# Patient Record
Sex: Female | Born: 1975 | Race: Black or African American | Hispanic: No | Marital: Married | Smoking: Never smoker
Health system: Southern US, Community
[De-identification: ages and names within clinical notes are randomized; demographics above are authoritative.]

## PROBLEM LIST (undated history)

## (undated) DIAGNOSIS — Z8719 Personal history of other diseases of the digestive system: Secondary | ICD-10-CM

## (undated) DIAGNOSIS — K8020 Calculus of gallbladder without cholecystitis without obstruction: Secondary | ICD-10-CM

## (undated) DIAGNOSIS — K219 Gastro-esophageal reflux disease without esophagitis: Secondary | ICD-10-CM

## (undated) HISTORY — PX: FOOT SURGERY: SHX648

## (undated) HISTORY — PX: TONSILLECTOMY: SUR1361

## (undated) HISTORY — PX: ESOPHAGOGASTRODUODENOSCOPY: SHX1529

## (undated) HISTORY — PX: PLACEMENT OF BREAST IMPLANTS: SHX6334

---

## 2006-08-31 ENCOUNTER — Ambulatory Visit (HOSPITAL_COMMUNITY): Admission: RE | Admit: 2006-08-31 | Discharge: 2006-08-31 | Payer: Self-pay | Admitting: Obstetrics and Gynecology

## 2016-12-25 ENCOUNTER — Ambulatory Visit (HOSPITAL_BASED_OUTPATIENT_CLINIC_OR_DEPARTMENT_OTHER)
Admit: 2016-12-25 | Discharge: 2016-12-25 | Disposition: A | Discharge status: Home / Self Care | Payer: 59 | Attending: Emergency Medicine | Admitting: Emergency Medicine

## 2016-12-25 ENCOUNTER — Other Ambulatory Visit: Payer: Self-pay

## 2016-12-25 ENCOUNTER — Emergency Department (HOSPITAL_BASED_OUTPATIENT_CLINIC_OR_DEPARTMENT_OTHER)
Admission: EM | Admit: 2016-12-25 | Discharge: 2016-12-25 | Disposition: A | Discharge status: Home / Self Care | Payer: 59 | Attending: Emergency Medicine | Admitting: Emergency Medicine

## 2016-12-25 ENCOUNTER — Encounter (HOSPITAL_BASED_OUTPATIENT_CLINIC_OR_DEPARTMENT_OTHER): Payer: Self-pay

## 2016-12-25 ENCOUNTER — Emergency Department (HOSPITAL_BASED_OUTPATIENT_CLINIC_OR_DEPARTMENT_OTHER): Payer: 59

## 2016-12-25 DIAGNOSIS — K8020 Calculus of gallbladder without cholecystitis without obstruction: Secondary | ICD-10-CM | POA: Diagnosis not present

## 2016-12-25 DIAGNOSIS — R112 Nausea with vomiting, unspecified: Secondary | ICD-10-CM | POA: Diagnosis not present

## 2016-12-25 DIAGNOSIS — R1013 Epigastric pain: Secondary | ICD-10-CM | POA: Insufficient documentation

## 2016-12-25 LAB — COMPREHENSIVE METABOLIC PANEL
ALK PHOS: 45 U/L (ref 38–126)
ALT: 11 U/L — AB (ref 14–54)
AST: 19 U/L (ref 15–41)
Albumin: 4 g/dL (ref 3.5–5.0)
Anion gap: 6 (ref 5–15)
BUN: 13 mg/dL (ref 6–20)
CALCIUM: 9 mg/dL (ref 8.9–10.3)
CHLORIDE: 107 mmol/L (ref 101–111)
CO2: 24 mmol/L (ref 22–32)
CREATININE: 0.74 mg/dL (ref 0.44–1.00)
Glucose, Bld: 100 mg/dL — ABNORMAL HIGH (ref 65–99)
Potassium: 3.8 mmol/L (ref 3.5–5.1)
Sodium: 137 mmol/L (ref 135–145)
Total Bilirubin: 0.5 mg/dL (ref 0.3–1.2)
Total Protein: 7.2 g/dL (ref 6.5–8.1)

## 2016-12-25 LAB — CBC WITH DIFFERENTIAL/PLATELET
BASOS ABS: 0 10*3/uL (ref 0.0–0.1)
Basophils Relative: 0 %
Eosinophils Absolute: 0.1 10*3/uL (ref 0.0–0.7)
Eosinophils Relative: 1 %
HEMATOCRIT: 38.2 % (ref 36.0–46.0)
HEMOGLOBIN: 12.7 g/dL (ref 12.0–15.0)
LYMPHS ABS: 1.7 10*3/uL (ref 0.7–4.0)
LYMPHS PCT: 23 %
MCH: 31.9 pg (ref 26.0–34.0)
MCHC: 33.2 g/dL (ref 30.0–36.0)
MCV: 96 fL (ref 78.0–100.0)
Monocytes Absolute: 0.5 10*3/uL (ref 0.1–1.0)
Monocytes Relative: 7 %
NEUTROS ABS: 4.9 10*3/uL (ref 1.7–7.7)
NEUTROS PCT: 69 %
PLATELETS: 245 10*3/uL (ref 150–400)
RBC: 3.98 MIL/uL (ref 3.87–5.11)
RDW: 11.4 % — ABNORMAL LOW (ref 11.5–15.5)
WBC: 7.2 10*3/uL (ref 4.0–10.5)

## 2016-12-25 LAB — PREGNANCY, URINE: Preg Test, Ur: NEGATIVE

## 2016-12-25 LAB — TROPONIN I

## 2016-12-25 LAB — LIPASE, BLOOD: Lipase: 27 U/L (ref 11–51)

## 2016-12-25 MED ORDER — TRAMADOL HCL 50 MG PO TABS: 50.0000 mg | ORAL_TABLET | Freq: Four times a day (QID) | ORAL | 0 refills | Status: DC | PRN

## 2016-12-25 MED ORDER — GI COCKTAIL ~~LOC~~
30.0000 mL | Freq: Once | ORAL | Status: AC
  Administered 2016-12-25: 30 mL via ORAL
  Filled 2016-12-25: qty 30

## 2016-12-25 MED ORDER — ONDANSETRON HCL 4 MG/2ML IJ SOLN
4.0000 mg | Freq: Once | INTRAMUSCULAR | Status: AC
  Administered 2016-12-25: 4 mg via INTRAVENOUS
  Filled 2016-12-25: qty 2

## 2016-12-25 MED ORDER — DICYCLOMINE HCL 10 MG/ML IM SOLN
20.0000 mg | Freq: Once | INTRAMUSCULAR | Status: AC
  Administered 2016-12-25: 20 mg via INTRAMUSCULAR
  Filled 2016-12-25: qty 2

## 2016-12-25 MED ORDER — OMEPRAZOLE 20 MG PO CPDR: 20.0000 mg | DELAYED_RELEASE_CAPSULE | Freq: Every day | ORAL | 0 refills | Status: DC

## 2016-12-25 MED ORDER — KETOROLAC TROMETHAMINE 30 MG/ML IJ SOLN
15.0000 mg | Freq: Once | INTRAMUSCULAR | Status: AC
  Administered 2016-12-25: 15 mg via INTRAVENOUS
  Filled 2016-12-25: qty 1

## 2016-12-25 NOTE — ED Notes (Signed)
ED Provider at bedside. 

## 2016-12-25 NOTE — ED Provider Notes (Signed)
[facility] EMERGENCY DEPARTMENT Provider Note   CSN: [id number] Arrival date & time: 12/25/16  0251     History   Chief Complaint Chief Complaint  Patient presents with  . Abdominal Pain    HPI Teresa Flores is a 41 y.o. female.  The history is provided by the patient.  Abdominal Pain   This is a new problem. The current episode started 3 to 5 hours ago. The problem occurs constantly. The problem has not changed since onset.The pain is associated with an unknown (ate shrimp and drank wine prior to going to bed) factor. The pain is located in the epigastric region. The quality of the pain is burning. The pain is at a severity of 10/10. The pain is severe. Associated symptoms include nausea and vomiting. Pertinent negatives include anorexia, fever, belching, diarrhea, hematochezia, constipation, dysuria, frequency and hematuria. Nothing aggravates the symptoms. Nothing relieves the symptoms. Past workup does not include GI consult. Her past medical history does not include PUD, GERD or ulcerative colitis.    History reviewed. No pertinent past medical history.  There are no active problems to display for this patient.   Past Surgical History:  Procedure Laterality Date  . TONSILLECTOMY      OB History    No data available       Home Medications    Prior to Admission medications   Not on File    Family History No family history on file.  Social History Social History   Tobacco Use  . Smoking status: Never Smoker  . Smokeless tobacco: Never Used  Substance Use Topics  . Alcohol use: No    Frequency: Never  . Drug use: No     Allergies   Augmentin [amoxicillin-pot clavulanate]   Review of Systems Review of Systems  Constitutional: Negative for diaphoresis and fever.  Respiratory: Negative for chest tightness and shortness of breath.   Cardiovascular: Negative for chest pain, palpitations and leg swelling.  Gastrointestinal: Positive for  abdominal pain, nausea and vomiting. Negative for anorexia, blood in stool, constipation, diarrhea and hematochezia.  Genitourinary: Negative for dysuria, frequency and hematuria.  All other systems reviewed and are negative.    Physical Exam Updated Vital Signs BP 119/67 (BP Location: Left Arm)   Pulse 65   Temp 97.9 F (36.6 C) (Oral)   Resp (!) 22   Ht 5\' 4"  (1.626 m)   Wt 79.4 kg (175 lb)   LMP 12/05/2016   SpO2 100%   BMI 30.04 kg/m   Physical Exam  Constitutional: She is oriented to person, place, and time. She appears well-developed and well-nourished. No distress.  HENT:  Head: Normocephalic and atraumatic.  Mouth/Throat: Oropharynx is clear and moist. No oropharyngeal exudate.  Eyes: Conjunctivae are normal. Pupils are equal, round, and reactive to light.  Neck: Normal range of motion. Neck supple.  Cardiovascular: Normal rate, regular rhythm, normal heart sounds and intact distal pulses.  Pulmonary/Chest: Effort normal and breath sounds normal. No stridor. She has no wheezes.  Abdominal: Soft. She exhibits no mass. Bowel sounds are increased. There is no hepatosplenomegaly. There is no tenderness. There is no rigidity, no rebound, no guarding, no tenderness at McBurney's point and negative Murphy's sign.  Musculoskeletal: Normal range of motion.  Neurological: She is alert and oriented to person, place, and time. She displays normal reflexes.  Skin: Skin is warm and dry. Capillary refill takes less than 2 seconds.  Psychiatric: She has a normal mood and affect.  Nursing note and vitals reviewed.    ED Treatments / Results  Labs (all labs ordered are listed, but only abnormal results are displayed)  Results for orders placed or performed during the hospital encounter of 12/25/16  CBC with Differential/Platelet  Result Value Ref Range   WBC 7.2 4.0 - 10.5 K/uL   RBC 3.98 3.87 - 5.11 MIL/uL   Hemoglobin 12.7 12.0 - 15.0 g/dL   HCT 16.1 09.6 - 04.5 %   MCV 96.0  78.0 - 100.0 fL   MCH 31.9 26.0 - 34.0 pg   MCHC 33.2 30.0 - 36.0 g/dL   RDW 40.9 (L) 81.1 - 91.4 %   Platelets 245 150 - 400 K/uL   Neutrophils Relative % 69 %   Neutro Abs 4.9 1.7 - 7.7 K/uL   Lymphocytes Relative 23 %   Lymphs Abs 1.7 0.7 - 4.0 K/uL   Monocytes Relative 7 %   Monocytes Absolute 0.5 0.1 - 1.0 K/uL   Eosinophils Relative 1 %   Eosinophils Absolute 0.1 0.0 - 0.7 K/uL   Basophils Relative 0 %   Basophils Absolute 0.0 0.0 - 0.1 K/uL  Comprehensive metabolic panel  Result Value Ref Range   Sodium 137 135 - 145 mmol/L   Potassium 3.8 3.5 - 5.1 mmol/L   Chloride 107 101 - 111 mmol/L   CO2 24 22 - 32 mmol/L   Glucose, Bld 100 (H) 65 - 99 mg/dL   BUN 13 6 - 20 mg/dL   Creatinine, Ser 7.82 0.44 - 1.00 mg/dL   Calcium 9.0 8.9 - 95.6 mg/dL   Total Protein 7.2 6.5 - 8.1 g/dL   Albumin 4.0 3.5 - 5.0 g/dL   AST 19 15 - 41 U/L   ALT 11 (L) 14 - 54 U/L   Alkaline Phosphatase 45 38 - 126 U/L   Total Bilirubin 0.5 0.3 - 1.2 mg/dL   GFR calc non Af Amer >60 >60 mL/min   GFR calc Af Amer >60 >60 mL/min   Anion gap 6 5 - 15  Troponin I  Result Value Ref Range   Troponin I <0.03 <0.03 ng/mL  Pregnancy, urine  Result Value Ref Range   Preg Test, Ur NEGATIVE NEGATIVE   No results found.  EKG  EKG Interpretation  Date/Time:  Saturday December 25 2016 03:11:12 EST Ventricular Rate:  47 PR Interval:    QRS Duration: 117 QT Interval:  431 QTC Calculation: 381 R Axis:   54 Text Interpretation:  Sinus bradycardia Confirmed by Kimba Lottes ([id number]) on 12/25/2016 3:25:57 AM       Radiology No results found.  Procedures Procedures (including critical care time)  Medications Ordered in ED Medications  gi cocktail (Maalox,Lidocaine,Donnatal) (30 mLs Oral Given 12/25/16 0316)  dicyclomine (BENTYL) injection 20 mg (20 mg Intramuscular Given 12/25/16 0325)  ondansetron (ZOFRAN) injection 4 mg (4 mg Intravenous Given 12/25/16 0324)  ketorolac (TORADOL) 30 MG/ML injection  15 mg (15 mg Intravenous Given 12/25/16 0350)       Final Clinical Impressions(s) / ED Diagnoses   Patient denies RUQ pain and has no tenderness on exam and states it is non radiating and in below the xiphoid. There are gallstones seen on CT scan.  Patient was offered transfer to [facility] for US of the gall bladder.  Patient declines and would rather follow up for outpatient [place] at [facility]. She is advised no eating prior to the [place].  No greasy or spicy foods.  Will prescribe ultram for severe  pain and prilosec.  I suspect H Pylori and have advised gerd friendly diet and close follow up with GI and [facility] for elective removal of the gall bladder.  HEART score is zero and the patient is a very low risk for mace.  Patient felt improved post GI cocktail and I believe the symptoms are GI in nature.  Patient verbalizes understanding of all instructions and agrees to follow up.    Strict return precautions for intractable  vomiting,  chest pain, dyspnea on exertion,shortness of breath, persistent abdominal pain, Inability to tolerate liquids or food, cough, altered mental status or any concerns. No signs of systemic illness or infection. The patient is nontoxic-appearing on exam and vital signs are within normal limits.    I have reviewed the triage vital signs and the nursing notes. Pertinent labs &imaging results that were available during my care of the patient were reviewed by me and considered in my medical decision making (see chart for details).  After history, exam, and medical workup I feel the patient has been appropriately medically screened and is safe for discharge home. Pertinent diagnoses were discussed with the patient. Patient was given return precautions   Zelma Mazariego, MD 12/25/16 [id number]

## 2016-12-25 NOTE — ED Triage Notes (Signed)
Pt presents with abdominal "burning" and vomiting, onset 11pm last night. Pt took Pepto bismol prior to arrival with no relief. Pt denies diarrhea.

## 2016-12-25 NOTE — ED Notes (Signed)
Pt returned from CT °

## 2016-12-25 NOTE — ED Notes (Addendum)
Pt transported to CT ?

## 2017-01-19 ENCOUNTER — Other Ambulatory Visit: Payer: Self-pay | Admitting: General Surgery

## 2017-02-13 NOTE — H&P (Signed)
Teresa ButtsAnn Flores [facility] Patient #: [id number] DOB: [birth year] Married / Language: English / Race: Black or African American Female         History of Present Illness The patient is a 42 year old female who presents for evaluation of gall stones. This is a very pleasant 42 year old woman, referred by Dr. Daun PeacockPalombo in the ED for evaluation of gallstones and abdominal pain. Dr. Rita OharaSamuel Kelley is her PCP. Dr. Bosie ClosSchooler is her gastroenterologist and performed  upper endoscopy on Jan. 4, 2019.Marland Kitchen. She is feeling good today.      She has had some mild episodes of nausea over the past month or so. On December 8, after eating a plate of spicy shrimp, she had a episode of severe, horrible epigastric and right upper quadrant pain associated with nausea and vomiting and burning sensation. This lasted almost 12 hours. She went to the emergency department was given a GI cocktail. All of her lab work was normal. Urine pregnancy test was normal. She got a CT scan, stone protocol which showed no kidney stones or pancreatitis but gallstones were noted. She had a follow-up ultrasound on the same day which showed multiple gallstones, no inflammatory changes, CBD 5 mm. Pain was mostly subsided after the ultrasound was done. Since that time she's had some mild nausea after meals. She is having alternating constipation and loose stools but nothing real dramatic. She is back at work       She was referred to me and to Dr. Bosie ClosSchooler. She has seen Dr. Bosie ClosSchooler and recent endoscopy shows chronic inactive gastritis, H. Pylori negative. This does not explain her symptoms..     Past history is negative for any significant medical or surgical problems. BMI 31 Family history reveals mother died of lung cancer. Father died of lung cancer. Social history reveals she is married with 2 children. Denies tobacco. Takes alcohol but only occasionally. 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      I told her that her episode of pain sounds like a bad gallbladder attack and I was suspicious that this was due to gallbladder disease and would recur. I advised low-fat diet      I've discussed the indications, details, techniques, and risks of the surgery in detail. She is aware of the risk of bleeding, infection, conversion to open laparotomy, postop bile leak, port site hernia, injury to adjacent organs with major reconstructive surgery. She understands all of these issues. All of her questions are answered. She agrees with the algorithm for care.      Past Surgical History  Breast Augmentation  Bilateral. Foot Surgery  Left. Oral Surgery  Tonsillectomy   Diagnostic Studies History  Colonoscopy  never Mammogram  1-3 years ago Pap Smear  1-5 years ago  Allergies  Augmentin *PENICILLINS*  Minocycline HCl *TETRACYCLINES*  Allergies Reconciled   Medication History Omeprazole (20MG  Tablet DR, Oral) Active. Medications Reconciled  Social History Alcohol use  Occasional alcohol use. Caffeine use  Tea. No drug use  Tobacco use  Never smoker.  Family History  Cancer  Father, Mother.  Pregnancy / Birth History  Contraceptive History  Contraceptive implant. Gravida  4 Length (months) of breastfeeding  12-24 Maternal age  40-25 Para  2 Regular periods   Other Problems  Cholelithiasis  Hypercholesterolemia     Review of Systems General Not Present- Appetite Loss,  Chills, Fatigue, Fever, Night Sweats, Weight Gain and Weight Loss. Skin Not Present- Change in Wart/Mole, Dryness, Hives, Jaundice, New Lesions, Non-Healing Wounds, Rash and Ulcer. HEENT Present- Wears glasses/contact lenses. Not Present- Earache, Hearing Loss, Hoarseness, Nose Bleed, Oral Ulcers, Ringing in the Ears, Seasonal Allergies, Sinus Pain, Sore Throat, Visual  Disturbances and Yellow Eyes. Respiratory Not Present- Bloody sputum, Chronic Cough, Difficulty Breathing, Snoring and Wheezing. Breast Not Present- Breast Mass, Breast Pain, Nipple Discharge and Skin Changes. Cardiovascular Not Present- Chest Pain, Difficulty Breathing Lying Down, Leg Cramps, Palpitations, Rapid Heart Rate, Shortness of Breath and Swelling of Extremities. Gastrointestinal Not Present- Abdominal Pain, Bloating, Bloody Stool, Change in Bowel Habits, Chronic diarrhea, Constipation, Difficulty Swallowing, Excessive gas, Gets full quickly at meals, Hemorrhoids, Indigestion, Nausea, Rectal Pain and Vomiting. Neurological Not Present- Decreased Memory, Fainting, Headaches, Numbness, Seizures, Tingling, Tremor, Trouble walking and Weakness. Psychiatric Not Present- Anxiety, Bipolar, Change in Sleep Pattern, Depression, Fearful and Frequent crying. Endocrine Not Present- Cold Intolerance, Excessive Hunger, Hair Changes, Heat Intolerance, Hot flashes and New Diabetes. Hematology Not Present- Blood Thinners, Easy Bruising, Excessive bleeding, Gland problems, HIV and Persistent Infections.  Vitals  Weight: 184.5 lb Height: 64in Body Surface Area: 1.89 m Body Mass Index: 31.67 kg/m  Pulse: 97 (Regular)  BP: 125/72 (Sitting, Left Arm, Standard)       Physical Exam  General Mental Status-Alert. General Appearance-Consistent with stated age. Hydration-Well hydrated. Voice-Normal. Note: BMI 31.6   Head and Neck Head-normocephalic, atraumatic with no lesions or palpable masses. Trachea-midline. Thyroid Gland Characteristics - normal size and consistency.  Eye Eyeball - Bilateral-Extraocular movements intact. Sclera/Conjunctiva - Bilateral-No scleral icterus.  Chest and Lung Exam Chest and lung exam reveals -quiet, even and easy respiratory effort with no use of accessory muscles and on auscultation, normal breath sounds, no adventitious sounds  and normal vocal resonance. Inspection Chest Wall - Normal. Back - normal.  Cardiovascular Cardiovascular examination reveals -normal heart sounds, regular rate and rhythm with no murmurs and normal pedal pulses bilaterally.  Abdomen Inspection Inspection of the abdomen reveals - No Hernias. Skin - Scar - no surgical scars. Palpation/Percussion Palpation and Percussion of the abdomen reveal - Soft, Non Tender, No Rebound tenderness, No Rigidity (guarding) and No hepatosplenomegaly. Auscultation Auscultation of the abdomen reveals - Bowel sounds normal. Note: Abdomen soft. Nondistended. Nontender. No mass. No percussion tenderness subcostal margin.   Neurologic Neurologic evaluation reveals -alert and oriented x 3 with no impairment of recent or remote memory. Mental Status-Normal.  Musculoskeletal Normal Exam - Left-Upper Extremity Strength Normal and Lower Extremity Strength Normal. Normal Exam - Right-Upper Extremity Strength Normal and Lower Extremity Strength Normal.  Lymphatic Head & Neck  General Head & Neck Lymphatics: Bilateral - Description - Normal. Axillary  General Axillary Region: Bilateral - Description - Normal. Tenderness - Non Tender. Femoral & Inguinal  Generalized Femoral & Inguinal Lymphatics: Bilateral - Description - Normal. Tenderness - Non Tender.    Assessment & Plan  GALLSTONES (K80.20)    Your recent episode of upper abdominal and right upper quadrant pain and nausea and vomiting sounds like a gallbladder attack You have had some mild episodes of nausea both before and after that severe attack your CT scan confirms the presence of multiple gallstones but no other significant problems and certainly no other source of pain was found on these imaging studies. Your lab work is normal so I do not suspect that you have suffered a complication of gallstone disease.  It is possible that there could  be a problem with your esophagus or  stomach, and you are scheduled to have an upper endoscopy by Dr. Bosie Clos this coming Friday  If Dr. Bosie Clos does not find anything, then clearly your episode of pain was due to your gallbladder, and is likely to recur  For now, I recommend a low-fat diet  Since Dr. Bosie Clos did not find anything diagnostic, then elective cholecystectomy is advised as you are likely to have further attacks in the future.  We have discussed the indications, details, techniques, and risks of this surgery in detail Please review the patient information booklet that I reviewed with you Let me know when you are ready to schedule gallbladder surgery  BMI 31.0-31.9,ADULT (Z68.31)    Angelia Mould. Derrell Lolling, M.D., [facility] General and Minimally invasive Surgery Breast and Colorectal Surgery Office:   [phone] Pager:   [phone]

## 2017-02-15 NOTE — Pre-Procedure Instructions (Signed)
Teresa Flores  02/15/2017      [id number] [place],  - [address] [place] [id number] Phone: [phone] Fax: [phone]    Your procedure is scheduled on February 1  [facility]30 [facility].M.  Call this number if you have problems the morning of surgery:  [phone]   Remember:  Do not eat food or drink liquids after midnight.  Continue all medications as directed by your physician except follow these medication instructions before surgery below  Please complete your PRE-SURGERY ENSURE that was given to before you leave your house the morning of surgery.  Please, if able, drink it in one setting. DO NOT SIP.   Take these medicines the morning of surgery with A SIP OF WATER  omeprazole (PRILOSEC)  7 days prior to surgery STOP taking any Aspirin(unless otherwise instructed by your surgeon), Aleve, Naproxen, Ibuprofen, Motrin, Advil, Goody's, BC's, all herbal medications, fish oil, and all vitamins    Do not wear jewelry, make-up or nail polish.  Do not wear lotions, powders, or perfumes, or deodorant.  Do not shave 48 hours prior to surgery.   Do not bring valuables to the hospital.  [facility] is not responsible for any belongings or valuables.  Contacts, dentures or bridgework may not be worn into surgery.  Leave your suitcase in the car.  After surgery it may be brought to your room.  For patients admitted to the hospital, discharge time will be determined by your treatment team.  Patients discharged the day of surgery will not be allowed to drive home.    Special instructions:   [place]- Preparing For Surgery  Before surgery, you can play an important role. Because skin is not sterile, your skin needs to be as free of germs as possible. You can reduce the number of germs on your skin by washing with CHG (chlorahexidine gluconate) Soap before surgery.  CHG is an antiseptic  cleaner which kills germs and bonds with the skin to continue killing germs even after washing.  Please do not use if you have an allergy to CHG or antibacterial soaps. If your skin becomes reddened/irritated stop using the CHG.  Do not shave (including legs and underarms) for at least 48 hours prior to first CHG shower. It is OK to shave your face.  Please follow these instructions carefully.   1. Shower the NIGHT BEFORE SURGERY and the MORNING OF SURGERY with CHG.   2. If you chose to wash your hair, wash your hair first as usual with your normal shampoo.  3. After you shampoo, rinse your hair and body thoroughly to remove the shampoo.  4. Use CHG as you would any other liquid soap. You can apply CHG directly to the skin and wash gently with a scrungie or a clean washcloth.   5. Apply the CHG Soap to your body ONLY FROM THE NECK DOWN.  Do not use on open wounds or open sores. Avoid contact with your eyes, ears, mouth and genitals (private parts). Wash Face and genitals (private parts)  with your normal soap.  6. Wash thoroughly, paying special attention to the area where your surgery will be performed.  7. Thoroughly rinse your body with warm water from the neck down.  8. DO NOT shower/wash with your normal soap after using and rinsing off the CHG Soap.  9. Pat yourself dry with a CLEAN TOWEL.  10. Wear CLEAN  PAJAMAS to bed the night before surgery, wear comfortable clothes the morning of surgery  11. Place CLEAN SHEETS on your bed the night of your first shower and DO NOT SLEEP WITH PETS.    Day of Surgery: Do not apply any deodorants/lotions. Please wear clean clothes to the hospital/surgery center.      Please read over the following fact sheets that you were given.

## 2017-02-16 ENCOUNTER — Encounter (HOSPITAL_COMMUNITY)
Admission: RE | Admit: 2017-02-16 | Discharge: 2017-02-16 | Disposition: A | Discharge status: Home / Self Care | Payer: 59 | Source: Ambulatory Visit | Attending: General Surgery | Admitting: General Surgery

## 2017-02-16 ENCOUNTER — Encounter (HOSPITAL_COMMUNITY): Payer: Self-pay

## 2017-02-16 ENCOUNTER — Other Ambulatory Visit: Payer: Self-pay

## 2017-02-16 DIAGNOSIS — K2950 Unspecified chronic gastritis without bleeding: Secondary | ICD-10-CM | POA: Diagnosis not present

## 2017-02-16 DIAGNOSIS — Z880 Allergy status to penicillin: Secondary | ICD-10-CM | POA: Diagnosis not present

## 2017-02-16 DIAGNOSIS — K219 Gastro-esophageal reflux disease without esophagitis: Secondary | ICD-10-CM | POA: Diagnosis not present

## 2017-02-16 DIAGNOSIS — K5900 Constipation, unspecified: Secondary | ICD-10-CM | POA: Diagnosis not present

## 2017-02-16 DIAGNOSIS — E7800 Pure hypercholesterolemia, unspecified: Secondary | ICD-10-CM | POA: Diagnosis not present

## 2017-02-16 DIAGNOSIS — Z79899 Other long term (current) drug therapy: Secondary | ICD-10-CM | POA: Diagnosis not present

## 2017-02-16 DIAGNOSIS — K8010 Calculus of gallbladder with chronic cholecystitis without obstruction: Secondary | ICD-10-CM | POA: Diagnosis present

## 2017-02-16 HISTORY — DX: Gastro-esophageal reflux disease without esophagitis: K21.9

## 2017-02-16 HISTORY — DX: Personal history of other diseases of the digestive system: Z87.19

## 2017-02-16 LAB — COMPREHENSIVE METABOLIC PANEL
ALK PHOS: 46 U/L (ref 38–126)
ALT: 15 U/L (ref 14–54)
AST: 19 U/L (ref 15–41)
Albumin: 3.7 g/dL (ref 3.5–5.0)
Anion gap: 9 (ref 5–15)
BUN: 9 mg/dL (ref 6–20)
CALCIUM: 9.7 mg/dL (ref 8.9–10.3)
CO2: 25 mmol/L (ref 22–32)
Chloride: 103 mmol/L (ref 101–111)
Creatinine, Ser: 0.81 mg/dL (ref 0.44–1.00)
Glucose, Bld: 89 mg/dL (ref 65–99)
Potassium: 4.2 mmol/L (ref 3.5–5.1)
Sodium: 137 mmol/L (ref 135–145)
Total Bilirubin: 0.6 mg/dL (ref 0.3–1.2)
Total Protein: 6.9 g/dL (ref 6.5–8.1)

## 2017-02-16 LAB — CBC WITH DIFFERENTIAL/PLATELET
Basophils Absolute: 0 10*3/uL (ref 0.0–0.1)
Basophils Relative: 0 %
Eosinophils Absolute: 0.1 10*3/uL (ref 0.0–0.7)
Eosinophils Relative: 2 %
HCT: 37.9 % (ref 36.0–46.0)
HEMOGLOBIN: 12.6 g/dL (ref 12.0–15.0)
LYMPHS ABS: 1.6 10*3/uL (ref 0.7–4.0)
LYMPHS PCT: 21 %
MCH: 32.2 pg (ref 26.0–34.0)
MCHC: 33.2 g/dL (ref 30.0–36.0)
MCV: 96.9 fL (ref 78.0–100.0)
Monocytes Absolute: 0.5 10*3/uL (ref 0.1–1.0)
Monocytes Relative: 7 %
NEUTROS ABS: 5.2 10*3/uL (ref 1.7–7.7)
NEUTROS PCT: 70 %
Platelets: 295 10*3/uL (ref 150–400)
RBC: 3.91 MIL/uL (ref 3.87–5.11)
RDW: 12 % (ref 11.5–15.5)
WBC: 7.5 10*3/uL (ref 4.0–10.5)

## 2017-02-16 NOTE — Progress Notes (Addendum)
PCP is Dr. Jenita SeashoreSamuel Flores Denies ever seeing a cardiologist.  Denies ever having a stress test, echo, or card cath. Denies any chest pain or fever. Reports a non productive cough.  Ensure pre-surgery drink given with instructions to drink right before leaving her home on the day of surgery.- voices understanding.

## 2017-02-17 NOTE — Anesthesia Preprocedure Evaluation (Addendum)
Anesthesia Evaluation  Patient identified by MRN, date of birth, ID band Patient awake    Reviewed: Allergy & Precautions, NPO status , Patient's Chart, lab work & pertinent test results  Airway Mallampati: I       Dental no notable dental hx. (+) Teeth Intact   Pulmonary neg pulmonary ROS,    Pulmonary exam normal breath sounds clear to auscultation       Cardiovascular Normal cardiovascular exam Rhythm:Regular Rate:Normal     Neuro/Psych negative neurological ROS  negative psych ROS   GI/Hepatic Neg liver ROS, hiatal hernia,   Endo/Other  negative endocrine ROS  Renal/GU negative Renal ROS  negative genitourinary   Musculoskeletal negative musculoskeletal ROS (+)   Abdominal Normal abdominal exam  (+)   Peds  Hematology negative hematology ROS (+)   Anesthesia Other Findings   Reproductive/Obstetrics negative OB ROS                            Anesthesia Physical Anesthesia Plan  ASA: II  Anesthesia Plan: General   Post-op Pain Management:    Induction: Intravenous  PONV Risk Score and Plan: 4 or greater and Ondansetron, Dexamethasone, Midazolam and Scopolamine patch - Pre-op  Airway Management Planned: Oral ETT  Additional Equipment:   Intra-op Plan:   Post-operative Plan: Extubation in OR  Informed Consent: I have reviewed the patients History and Physical, chart, labs and discussed the procedure including the risks, benefits and alternatives for the proposed anesthesia with the patient or authorized representative who has indicated his/her understanding and acceptance.   Dental advisory given  Plan Discussed with: CRNA and Surgeon  Anesthesia Plan Comments:        Anesthesia Quick Evaluation

## 2017-02-18 ENCOUNTER — Ambulatory Visit (HOSPITAL_COMMUNITY)
Admission: RE | Admit: 2017-02-18 | Discharge: 2017-02-18 | Disposition: A | Discharge status: Home / Self Care | Payer: 59 | Source: Ambulatory Visit | Attending: General Surgery | Admitting: General Surgery

## 2017-02-18 ENCOUNTER — Ambulatory Visit (HOSPITAL_COMMUNITY): Payer: 59 | Admitting: Emergency Medicine

## 2017-02-18 ENCOUNTER — Ambulatory Visit (HOSPITAL_COMMUNITY): Payer: 59 | Admitting: Anesthesiology

## 2017-02-18 ENCOUNTER — Ambulatory Visit (HOSPITAL_COMMUNITY): Payer: 59

## 2017-02-18 ENCOUNTER — Encounter (HOSPITAL_COMMUNITY)
Admission: RE | Disposition: A | Discharge status: Home / Self Care | Payer: Self-pay | Source: Ambulatory Visit | Attending: General Surgery

## 2017-02-18 ENCOUNTER — Encounter (HOSPITAL_COMMUNITY): Payer: Self-pay

## 2017-02-18 DIAGNOSIS — K8010 Calculus of gallbladder with chronic cholecystitis without obstruction: Secondary | ICD-10-CM | POA: Insufficient documentation

## 2017-02-18 DIAGNOSIS — Z880 Allergy status to penicillin: Secondary | ICD-10-CM | POA: Insufficient documentation

## 2017-02-18 DIAGNOSIS — E7800 Pure hypercholesterolemia, unspecified: Secondary | ICD-10-CM | POA: Insufficient documentation

## 2017-02-18 DIAGNOSIS — Z419 Encounter for procedure for purposes other than remedying health state, unspecified: Secondary | ICD-10-CM

## 2017-02-18 DIAGNOSIS — K8020 Calculus of gallbladder without cholecystitis without obstruction: Secondary | ICD-10-CM | POA: Diagnosis present

## 2017-02-18 DIAGNOSIS — K219 Gastro-esophageal reflux disease without esophagitis: Secondary | ICD-10-CM | POA: Insufficient documentation

## 2017-02-18 DIAGNOSIS — Z79899 Other long term (current) drug therapy: Secondary | ICD-10-CM | POA: Insufficient documentation

## 2017-02-18 DIAGNOSIS — K2950 Unspecified chronic gastritis without bleeding: Secondary | ICD-10-CM | POA: Insufficient documentation

## 2017-02-18 DIAGNOSIS — K5900 Constipation, unspecified: Secondary | ICD-10-CM | POA: Insufficient documentation

## 2017-02-18 HISTORY — DX: Calculus of gallbladder without cholecystitis without obstruction: K80.20

## 2017-02-18 HISTORY — PX: CHOLECYSTECTOMY: SHX55

## 2017-02-18 LAB — POCT PREGNANCY, URINE: Preg Test, Ur: NEGATIVE

## 2017-02-18 SURGERY — LAPAROSCOPIC CHOLECYSTECTOMY WITH INTRAOPERATIVE CHOLANGIOGRAM
Anesthesia: General | Site: Abdomen

## 2017-02-18 MED ORDER — DEXAMETHASONE SODIUM PHOSPHATE 10 MG/ML IJ SOLN
INTRAMUSCULAR | Status: DC | PRN
  Administered 2017-02-18: 10 mg via INTRAVENOUS

## 2017-02-18 MED ORDER — CELECOXIB 200 MG PO CAPS
200.0000 mg | ORAL_CAPSULE | ORAL | Status: AC
  Administered 2017-02-18: 200 mg via ORAL
  Filled 2017-02-18: qty 1

## 2017-02-18 MED ORDER — LIDOCAINE IN D5W 4-5 MG/ML-% IV SOLN
1.0000 mg/min | INTRAVENOUS | Status: DC
  Filled 2017-02-18: qty 500

## 2017-02-18 MED ORDER — HYDROMORPHONE HCL 1 MG/ML IJ SOLN
0.2500 mg | INTRAMUSCULAR | Status: DC | PRN
  Administered 2017-02-18: 0.5 mg via INTRAVENOUS

## 2017-02-18 MED ORDER — HYDROCODONE-ACETAMINOPHEN 5-325 MG PO TABS: 1.0000 | ORAL_TABLET | Freq: Four times a day (QID) | ORAL | 0 refills | Status: DC | PRN

## 2017-02-18 MED ORDER — IOPAMIDOL (ISOVUE-300) INJECTION 61%
INTRAVENOUS | Status: AC
  Filled 2017-02-18: qty 50

## 2017-02-18 MED ORDER — SUGAMMADEX SODIUM 200 MG/2ML IV SOLN
INTRAVENOUS | Status: DC | PRN
  Administered 2017-02-18: 167.8 mg via INTRAVENOUS

## 2017-02-18 MED ORDER — BUPIVACAINE-EPINEPHRINE 0.5% -1:200000 IJ SOLN
INTRAMUSCULAR | Status: DC | PRN
  Administered 2017-02-18: 15 mL

## 2017-02-18 MED ORDER — KETOROLAC TROMETHAMINE 30 MG/ML IJ SOLN
30.0000 mg | Freq: Once | INTRAMUSCULAR | Status: DC | PRN
  Administered 2017-02-18: 30 mg via INTRAVENOUS

## 2017-02-18 MED ORDER — ONDANSETRON HCL 4 MG/2ML IJ SOLN
INTRAMUSCULAR | Status: DC | PRN
  Administered 2017-02-18: 4 mg via INTRAVENOUS

## 2017-02-18 MED ORDER — PROMETHAZINE HCL 25 MG/ML IJ SOLN: 6.2500 mg | INTRAMUSCULAR | Status: DC | PRN

## 2017-02-18 MED ORDER — ROCURONIUM BROMIDE 100 MG/10ML IV SOLN
INTRAVENOUS | Status: DC | PRN
  Administered 2017-02-18: 40 mg via INTRAVENOUS

## 2017-02-18 MED ORDER — ESMOLOL HCL 100 MG/10ML IV SOLN
INTRAVENOUS | Status: DC | PRN
  Administered 2017-02-18 (×2): 20 mg via INTRAVENOUS

## 2017-02-18 MED ORDER — BUPIVACAINE-EPINEPHRINE (PF) 0.5% -1:200000 IJ SOLN
INTRAMUSCULAR | Status: AC
  Filled 2017-02-18: qty 30

## 2017-02-18 MED ORDER — PROPOFOL 10 MG/ML IV BOLUS
INTRAVENOUS | Status: AC
  Filled 2017-02-18: qty 40

## 2017-02-18 MED ORDER — MEPERIDINE HCL 25 MG/ML IJ SOLN: 6.2500 mg | INTRAMUSCULAR | Status: DC | PRN

## 2017-02-18 MED ORDER — SODIUM CHLORIDE 0.9 % IR SOLN
Status: DC | PRN
  Administered 2017-02-18: 1000 mL

## 2017-02-18 MED ORDER — CEFAZOLIN SODIUM-DEXTROSE 2-4 GM/100ML-% IV SOLN
INTRAVENOUS | Status: AC
  Filled 2017-02-18: qty 100

## 2017-02-18 MED ORDER — CHLORHEXIDINE GLUCONATE CLOTH 2 % EX PADS: 6.0000 | MEDICATED_PAD | Freq: Once | CUTANEOUS | Status: DC

## 2017-02-18 MED ORDER — STERILE WATER FOR IRRIGATION IR SOLN
Status: DC | PRN
  Administered 2017-02-18: 1000 mL

## 2017-02-18 MED ORDER — MIDAZOLAM HCL 5 MG/5ML IJ SOLN
INTRAMUSCULAR | Status: DC | PRN
  Administered 2017-02-18 (×2): 1 mg via INTRAVENOUS

## 2017-02-18 MED ORDER — PROPOFOL 10 MG/ML IV BOLUS
INTRAVENOUS | Status: DC | PRN
  Administered 2017-02-18: 160 mg via INTRAVENOUS
  Administered 2017-02-18: 40 mg via INTRAVENOUS

## 2017-02-18 MED ORDER — LACTATED RINGERS IV SOLN
INTRAVENOUS | Status: DC | PRN
  Administered 2017-02-18: 07:00:00 via INTRAVENOUS

## 2017-02-18 MED ORDER — LIDOCAINE HCL (CARDIAC) 20 MG/ML IV SOLN
INTRAVENOUS | Status: DC | PRN
  Administered 2017-02-18: 50 mg via INTRAVENOUS

## 2017-02-18 MED ORDER — MIDAZOLAM HCL 2 MG/2ML IJ SOLN
INTRAMUSCULAR | Status: AC
  Filled 2017-02-18: qty 2

## 2017-02-18 MED ORDER — FENTANYL CITRATE (PF) 250 MCG/5ML IJ SOLN
INTRAMUSCULAR | Status: AC
  Filled 2017-02-18: qty 5

## 2017-02-18 MED ORDER — LIDOCAINE IN D5W 4-5 MG/ML-% IV SOLN
INTRAVENOUS | Status: DC | PRN
  Administered 2017-02-18: 25 ug/kg/min via INTRAVENOUS

## 2017-02-18 MED ORDER — 0.9 % SODIUM CHLORIDE (POUR BTL) OPTIME
TOPICAL | Status: DC | PRN
  Administered 2017-02-18: 1000 mL

## 2017-02-18 MED ORDER — ACETAMINOPHEN 500 MG PO TABS
1000.0000 mg | ORAL_TABLET | ORAL | Status: AC
  Administered 2017-02-18: 1000 mg via ORAL
  Filled 2017-02-18: qty 2

## 2017-02-18 MED ORDER — FENTANYL CITRATE (PF) 100 MCG/2ML IJ SOLN
INTRAMUSCULAR | Status: DC | PRN
  Administered 2017-02-18 (×2): 50 ug via INTRAVENOUS
  Administered 2017-02-18: 150 ug via INTRAVENOUS

## 2017-02-18 MED ORDER — HYDROMORPHONE HCL 1 MG/ML IJ SOLN
INTRAMUSCULAR | Status: AC
  Filled 2017-02-18: qty 1

## 2017-02-18 MED ORDER — SODIUM CHLORIDE 0.9 % IV SOLN
INTRAVENOUS | Status: DC | PRN
  Administered 2017-02-18: 12 mL

## 2017-02-18 MED ORDER — KETOROLAC TROMETHAMINE 30 MG/ML IJ SOLN
INTRAMUSCULAR | Status: AC
  Filled 2017-02-18: qty 1

## 2017-02-18 SURGICAL SUPPLY — 42 items
APPLIER CLIP ROT 10 11.4 M/L (STAPLE) ×3
CANISTER SUCT 3000ML PPV (MISCELLANEOUS) ×3 IMPLANT
CHLORAPREP W/TINT 26ML (MISCELLANEOUS) ×3 IMPLANT
CLIP APPLIE ROT 10 11.4 M/L (STAPLE) ×1 IMPLANT
COVER MAYO STAND STRL (DRAPES) ×3 IMPLANT
COVER SURGICAL LIGHT HANDLE (MISCELLANEOUS) ×3 IMPLANT
DERMABOND ADVANCED (GAUZE/BANDAGES/DRESSINGS) ×2
DERMABOND ADVANCED .7 DNX12 (GAUZE/BANDAGES/DRESSINGS) ×1 IMPLANT
DRAPE C-ARM 42X72 X-RAY (DRAPES) ×3 IMPLANT
ELECT REM PT RETURN 9FT ADLT (ELECTROSURGICAL) ×3
ELECTRODE REM PT RTRN 9FT ADLT (ELECTROSURGICAL) ×1 IMPLANT
GLOVE BIO SURGEON STRL SZ7.5 (GLOVE) ×6 IMPLANT
GLOVE BIOGEL PI IND STRL 7.5 (GLOVE) ×1 IMPLANT
GLOVE BIOGEL PI IND STRL 8 (GLOVE) ×1 IMPLANT
GLOVE BIOGEL PI INDICATOR 7.5 (GLOVE) ×2
GLOVE BIOGEL PI INDICATOR 8 (GLOVE) ×2
GLOVE ECLIPSE 7.5 STRL STRAW (GLOVE) ×3 IMPLANT
GLOVE ECLIPSE 8.0 STRL XLNG CF (GLOVE) ×3 IMPLANT
GLOVE EUDERMIC 7 POWDERFREE (GLOVE) ×3 IMPLANT
GOWN STRL REUS W/ TWL LRG LVL3 (GOWN DISPOSABLE) ×2 IMPLANT
GOWN STRL REUS W/ TWL XL LVL3 (GOWN DISPOSABLE) ×2 IMPLANT
GOWN STRL REUS W/TWL 2XL LVL3 (GOWN DISPOSABLE) ×3 IMPLANT
GOWN STRL REUS W/TWL LRG LVL3 (GOWN DISPOSABLE) ×4
GOWN STRL REUS W/TWL XL LVL3 (GOWN DISPOSABLE) ×4
KIT BASIN OR (CUSTOM PROCEDURE TRAY) ×3 IMPLANT
KIT TURNOVER KIT B (KITS) ×3 IMPLANT
NS IRRIG 1000ML POUR BTL (IV SOLUTION) ×3 IMPLANT
PAD ARMBOARD 7.5X6 YLW CONV (MISCELLANEOUS) ×3 IMPLANT
POUCH SPECIMEN RETRIEVAL 10MM (ENDOMECHANICALS) ×3 IMPLANT
SCISSORS LAP 5X35 DISP (ENDOMECHANICALS) ×3 IMPLANT
SET CHOLANGIOGRAPH 5 50 .035 (SET/KITS/TRAYS/PACK) ×3 IMPLANT
SET IRRIG TUBING LAPAROSCOPIC (IRRIGATION / IRRIGATOR) ×3 IMPLANT
SLEEVE ENDOPATH XCEL 5M (ENDOMECHANICALS) ×3 IMPLANT
SPECIMEN JAR SMALL (MISCELLANEOUS) ×3 IMPLANT
SUT MNCRL AB 4-0 PS2 18 (SUTURE) ×3 IMPLANT
TOWEL GREEN STERILE (TOWEL DISPOSABLE) ×3 IMPLANT
TRAY LAPAROSCOPIC MC (CUSTOM PROCEDURE TRAY) ×3 IMPLANT
TROCAR XCEL BLUNT TIP 100MML (ENDOMECHANICALS) ×3 IMPLANT
TROCAR XCEL NON-BLD 11X100MML (ENDOMECHANICALS) ×3 IMPLANT
TROCAR XCEL NON-BLD 5MMX100MML (ENDOMECHANICALS) ×3 IMPLANT
TUBING INSUFFLATION (TUBING) ×3 IMPLANT
WATER STERILE IRR 1000ML POUR (IV SOLUTION) ×3 IMPLANT

## 2017-02-18 NOTE — Anesthesia Postprocedure Evaluation (Signed)
Anesthesia Post Note  Patient: Albertina Senegalnn S Lewis-Jones  Procedure(s) Performed: LAPAROSCOPIC CHOLECYSTECTOMY WITH INTRAOPERATIVE CHOLANGIOGRAM (N/A Abdomen)     Patient location during evaluation: PACU Anesthesia Type: General Level of consciousness: awake Pain management: pain level controlled Vital Signs Assessment: post-procedure vital signs reviewed and stable Respiratory status: spontaneous breathing Cardiovascular status: stable Postop Assessment: no apparent nausea or vomiting Anesthetic complications: no    Last Vitals:  Vitals:   02/18/17 1000 02/18/17 1010  BP: 110/74 108/74  Pulse: 80 84  Resp: 16 16  Temp: 36.6 C   SpO2: 100% 100%    Last Pain:  Vitals:   02/18/17 1010  TempSrc:   PainSc: 2    Pain Goal:                 Loyd Marhefka JR,JOHN Aashka Salomone

## 2017-02-18 NOTE — Interval H&P Note (Signed)
History and Physical Interval Note:  02/18/2017 6:10 AM  Teresa Flores  has presented today for surgery, with the diagnosis of GALLSTONES  The various methods of treatment have been discussed with the patient and family. After consideration of risks, benefits and other options for treatment, the patient has consented to  Procedure(s): LAPAROSCOPIC CHOLECYSTECTOMY WITH INTRAOPERATIVE CHOLANGIOGRAM (N/A) as a surgical intervention .  The patient's history has been reviewed, patient examined, no change in status, stable for surgery.  I have reviewed the patient's chart and labs.  Questions were answered to the patient's satisfaction.     Ernestene MentionHaywood M Julyanna Scholle

## 2017-02-18 NOTE — Transfer of Care (Signed)
Immediate Anesthesia Transfer of Care Note  Patient: Teresa Flores  Procedure(s) Performed: LAPAROSCOPIC CHOLECYSTECTOMY WITH INTRAOPERATIVE CHOLANGIOGRAM (N/A Abdomen)  Patient Location: PACU  Anesthesia Type:General  Level of Consciousness: awake, alert , oriented and patient cooperative  Airway & Oxygen Therapy: Patient Spontanous Breathing and Patient connected to nasal cannula oxygen  Post-op Assessment: Report given to RN and Post -op Vital signs reviewed and stable  Post vital signs: Reviewed and stable  Last Vitals:  Vitals:   02/18/17 0543  BP: 120/70  Pulse: 77  Resp: 20  Temp: 36.9 C  SpO2: 99%    Last Pain:  Vitals:   02/18/17 0543  TempSrc: Oral         Complications: No apparent anesthesia complications

## 2017-02-18 NOTE — Anesthesia Procedure Notes (Signed)
Procedure Name: Intubation Date/Time: 02/18/2017 7:27 AM Performed by: Wilder GladeWinn, Samanth Mirkin G, CRNA Pre-anesthesia Checklist: Patient identified, Emergency Drugs available, Suction available, Patient being monitored and Timeout performed Patient Re-evaluated:Patient Re-evaluated prior to induction Oxygen Delivery Method: Circle system utilized Preoxygenation: Pre-oxygenation with 100% oxygen Induction Type: IV induction Ventilation: Mask ventilation without difficulty Laryngoscope Size: Miller and 2 Grade View: Grade I Tube type: Oral Tube size: 7.0 mm Number of attempts: 1 Airway Equipment and Method: Stylet Placement Confirmation: ETT inserted through vocal cords under direct vision,  positive ETCO2 and breath sounds checked- equal and bilateral Secured at: 22 cm Tube secured with: Tape Dental Injury: Teeth and Oropharynx as per pre-operative assessment

## 2017-02-18 NOTE — Progress Notes (Signed)
POCT Pregnancy Urine was negative at [phone]

## 2017-02-18 NOTE — Op Note (Signed)
Patient Name:           Teresa Flores   Date of Surgery:        02/18/2017  Pre op Diagnosis:      Chronic cholecystitis with cholelithiasis  Post op Diagnosis:    Same  Procedure:                 Laparoscopic cholecystectomy with cholangiogram  Surgeon:                     Angelia MouldHaywood M. Derrell LollingIngram, M.D., FACS  Assistant:                      Chevis PrettyPaul Toth, M.D..   Indication for Assistant: Complex exposure.  Asst. couple to expedite case  Operative Indications:    This is a very pleasant 42 year old woman, referred by Dr. Daun PeacockPalombo in the ED for evaluation of gallstones and abdominal pain. Dr. Rita OharaSamuel Kelley is her PCP. Dr. Bosie ClosSchooler is her gastroenterologist and performed  upper endoscopy on Jan. 4, 2019.[place].       She has had some mild episodes of nausea over the past month or so. On December 8, after eating a plate of spicy shrimp, she had a episode of severe, horrible epigastric and right upper quadrant pain associated with nausea and vomiting and burning sensation. This lasted almost 12 hours. She went to the emergency department was given a GI cocktail. All of her lab work was normal. Urine pregnancy test was normal. She got a CT scan, stone protocol which showed no kidney stones or pancreatitis but gallstones were noted. She had a follow-up ultrasound on the same day which showed multiple gallstones, no inflammatory changes, CBD 5 mm. Pain was mostly subsided after the ultrasound was done. Since that time she's had some mild nausea after meals. She is having alternating constipation and loose stools but nothing real dramatic. She is back at work       She was referred to me and to Dr. Bosie ClosSchooler. She has seen Dr. Bosie ClosSchooler and recent endoscopy shows chronic inactive gastritis, H. Pylori negative. This does not explain her symptoms.. We have decided to proceed with cholecystectomy.     Operative Findings:       The gallbladder was thin-walled.  Did not appear inflamed.  The cystic duct was  very tiny but was patent.  The common duct appeared dilated.  The cholangiogram showed normal intrahepatic and extrahepatic biliary anatomy, no filling defect, and no obstruction with good flow of contrast into the duodenum.  The liver, stomach, duodenum and the rest of the intra-abdominal viscera were normal to inspection.  Procedure in Detail:     Following the induction of general endotracheal anesthesia the patient's abdomen was prepped and draped in a sterile fashion.  Intravenous antibiotics were given.  Surgical timeout was performed.  0.5% Marcaine with epinephrine was used as local infiltration anesthetic.     A vertical incision was made at the lower rim of the umbilicus.  A Hassan cannula was inserted with open technique under direct vision.  Pursestring suture of 0 Vicryl was used to secure the cannula and pneumoperitoneum was created and camera inserted.  Trochar was placed in subxiphoid region and two 5 mm trochars placed in the right upper quadrant.  The gallbladder was retracted.  I incised the peritoneum off of the neck of the gallbladder. I  isolated the cystic duct and the cystic artery and created a nice  window an excellent critical view behind these 2 structures.  The cystic artery was secured with metal clips and divided.  The cholangiogram catheter was able to be threaded into the cystic duct after a couple of attempts.  The cholangiogram was performed using the C-arm and the cholangiogram was normal as described above.  The cholangiogram catheter was removed, the cystic duct secured with multiple medical clips and divided.  The gallbladder was dissected from its bed,  placed in a specimen bag and removed.  I spilled a little bit of bile from a small hole in the gallbladder but no stones.  The operative field was copiously irrigated.  At the completion of the case the irrigation fluid was clear and there was no bleeding or bile leak.  The pneumoperitoneum was released.  The fascia at the  umbilicus was closed with 0 Vicryl sutures.  The skin incisions were closed with subcuticular sutures of 4-0 Monocryl and Dermabond.  Patient tolerated the procedure well was taken to PACU in stable condition.  EBL 10 mL.  Counts correct.  Complications none.   Addendum: I logged onto the [employer] and reviewed her prescription medication history     Ausencio Vaden M. Derrell Lolling, M.D., FACS General and Minimally Invasive Surgery Breast and Colorectal Surgery  02/18/2017 8:29 AM

## 2017-02-18 NOTE — Discharge Instructions (Signed)
[facility] ______[facility] °LAPAROSCOPIC SURGERY: POST OP INSTRUCTIONS °Always review your discharge instruction sheet given to you by the facility where your surgery was performed. °IF YOU HAVE DISABILITY OR FAMILY LEAVE FORMS, YOU MUST BRING THEM TO THE OFFICE FOR PROCESSING.   °DO NOT GIVE THEM TO YOUR DOCTOR. ° °1. A prescription for pain medication may be given to you upon discharge.  Take your pain medication as prescribed, if needed.  If narcotic pain medicine is not needed, then you may take acetaminophen (Tylenol) or ibuprofen (Advil) as needed. °2. Take your usually prescribed medications unless otherwise directed. °3. If you need a refill on your pain medication, please contact your pharmacy.  They will contact our office to request authorization. Prescriptions will not be filled after 5pm or on week-ends. °4. You should follow a light diet the first few days after arrival home, such as soup and crackers, etc.  Be sure to include lots of fluids daily. °5. Most patients will experience some swelling and bruising in the area of the incisions.  Ice packs will help.  Swelling and bruising can take several days to resolve.  °6. It is common to experience some constipation if taking pain medication after surgery.  Increasing fluid intake and taking a stool softener (such as Colace) will usually help or prevent this problem from occurring.  A mild laxative (Milk of Magnesia or Miralax) should be taken according to package instructions if there are no bowel movements after 48 hours. °7. Unless discharge instructions indicate otherwise, you may remove your bandages 24-48 hours after surgery, and you may shower at that time.  You may have steri-strips (small skin tapes) in place directly over the incision.  These strips should be left on the skin for 7-10 days.  If your surgeon used skin glue on the incision, you may shower in 24 hours.  The glue will flake off over the next 2-3 weeks.  Any sutures or  staples will be removed at the office during your follow-up visit. °8. ACTIVITIES:  You may resume regular (light) daily activities beginning the next day--such as daily self-care, walking, climbing stairs--gradually increasing activities as tolerated.  You may have sexual intercourse when it is comfortable.  Refrain from any heavy lifting or straining until approved by your doctor. °a. You may drive when you are no longer taking prescription pain medication, you can comfortably wear a seatbelt, and you can safely maneuver your car and apply brakes. °b. RETURN TO WORK:  __________________________________________________________ °9. You should see your doctor in the office for a follow-up appointment approximately 2-3 weeks after your surgery.  Make sure that you call for this appointment within a day or two after you arrive home to insure a convenient appointment time. °10. OTHER INSTRUCTIONS: __________________________________________________________________________________________________________________________ __________________________________________________________________________________________________________________________ °WHEN TO CALL YOUR DOCTOR: °1. Fever over 101.0 °2. Inability to urinate °3. Continued bleeding from incision. °4. Increased pain, redness, or drainage from the incision. °5. Increasing abdominal pain ° °The clinic staff is available to answer your questions during regular business hours.  Please don’t hesitate to call and ask to speak to one of the nurses for clinical concerns.  If you have a medical emergency, go to the nearest emergency room or call 911.  A surgeon from [facility] is always on call at the hospital. °[address], [address], [place], [place]  [address] ? [address] [address], , [place]   [address] °[phone] ? [phone] ? FAX [phone] °Web site:   [email or web address] °

## 2017-02-19 ENCOUNTER — Encounter (HOSPITAL_COMMUNITY): Payer: Self-pay | Admitting: General Surgery

## 2018-01-22 IMAGING — CT CT RENAL STONE PROTOCOL
2 of 4 series · 17 of 46 positions shown, 19 images · non-contrast
Comparison: None.

CLINICAL DATA: Epigastric pain beginning 6 hours ago. Vomiting and
nausea.

EXAM:
CT ABDOMEN AND PELVIS WITHOUT CONTRAST
TECHNIQUE: Multidetector CT imaging of the abdomen and pelvis was performed
following the standard protocol without IV contrast.

[Series 2: axial st · axial · 0.80mm/px · z∈[-385,+10]mm · 14 of 87 slices shown, 16 images]
[im 4/87  soft-tissue]
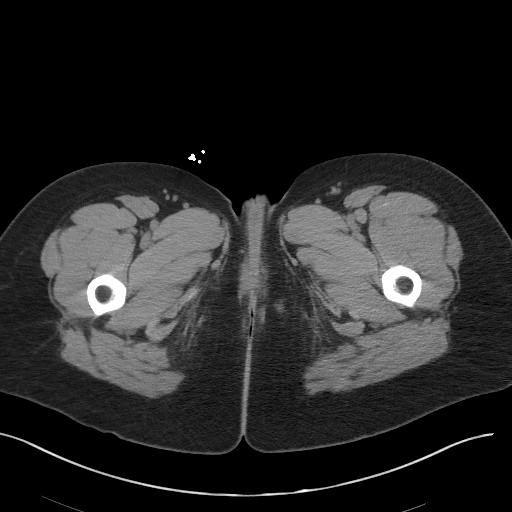
[im 4/87  bone]
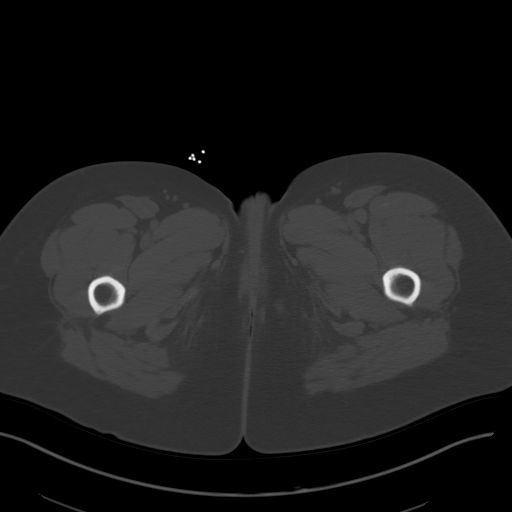
[im 10/87  soft-tissue]
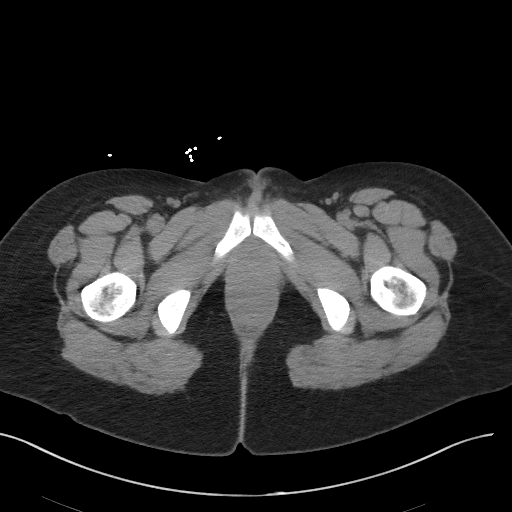
[im 17/87  soft-tissue]
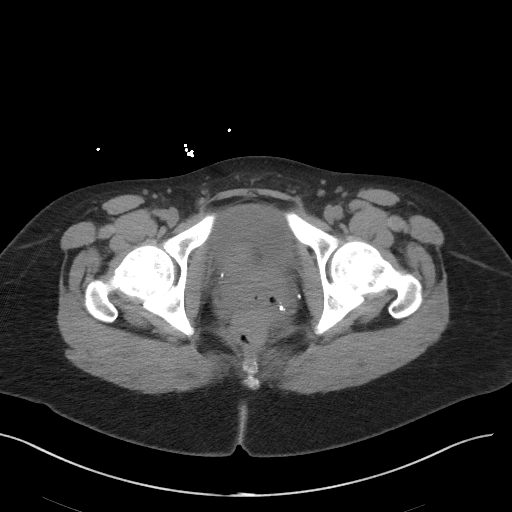
[im 24/87  soft-tissue]
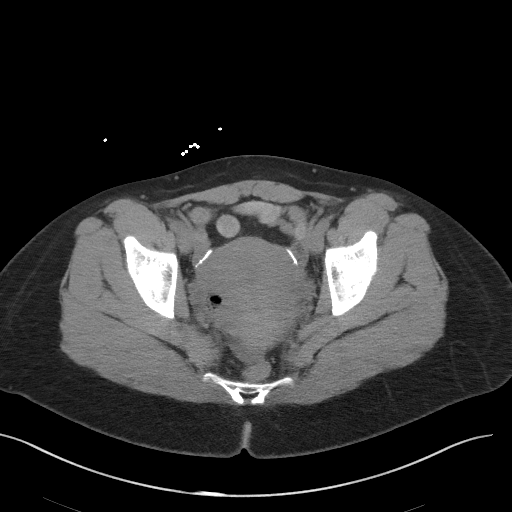
[im 30/87  soft-tissue]
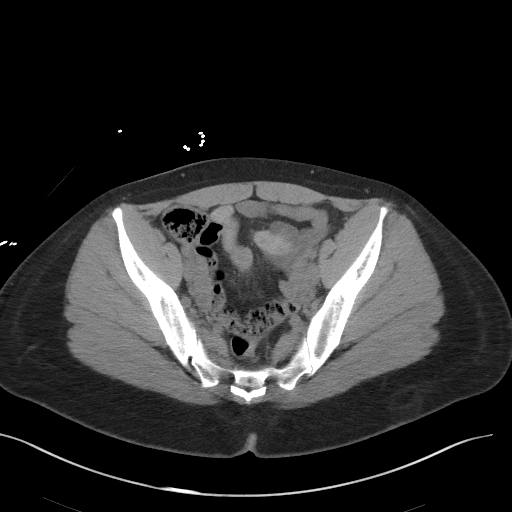
[im 34/87  soft-tissue]
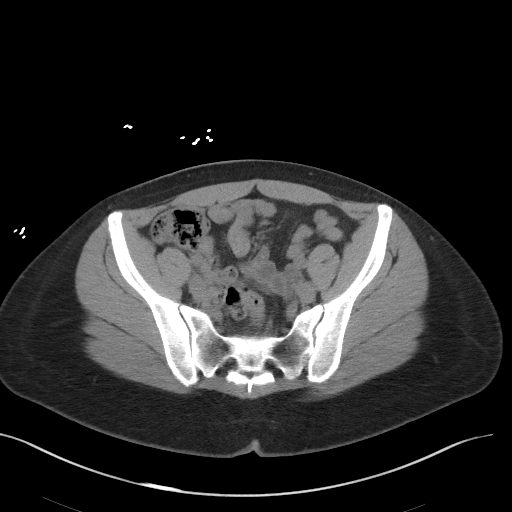
[im 40/87  soft-tissue]
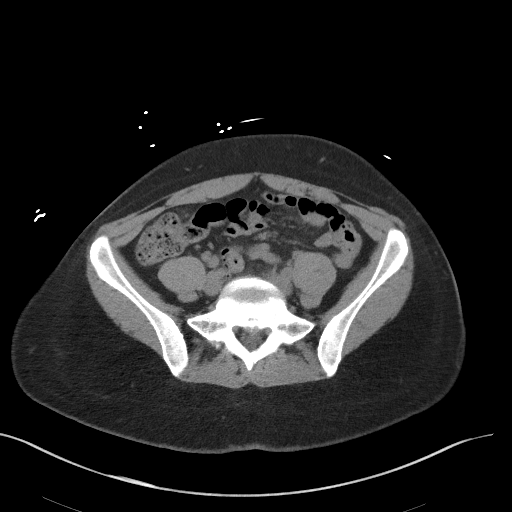
[im 47/87  soft-tissue]
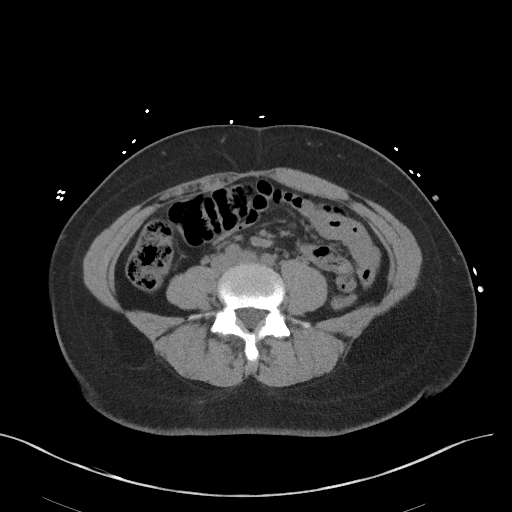
[im 53/87  soft-tissue]
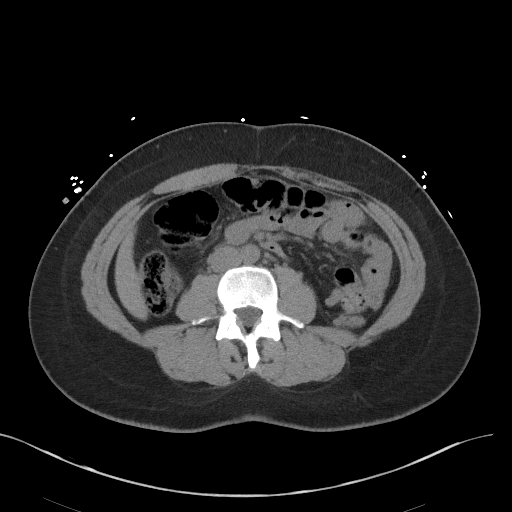
[im 53/87  bone]
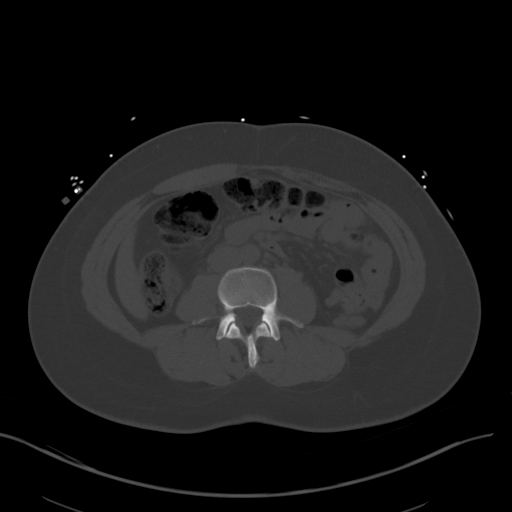
[im 57/87  soft-tissue]
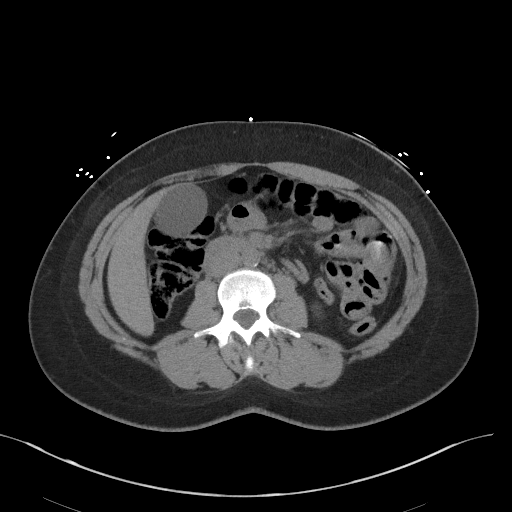
[im 63/87  soft-tissue]
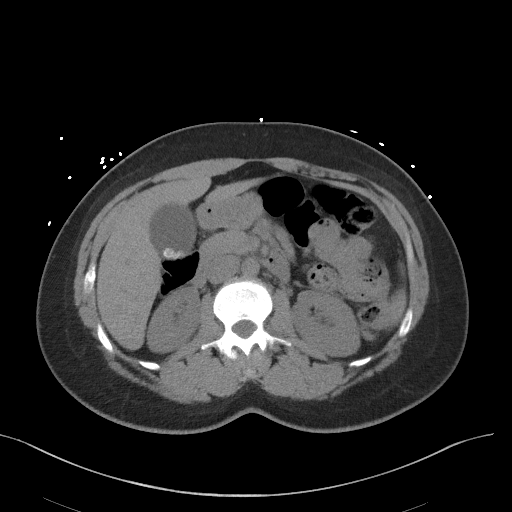
[im 70/87  soft-tissue]
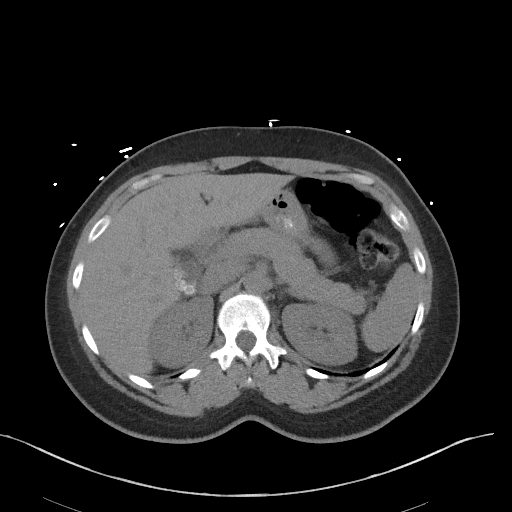
[im 77/87  soft-tissue]
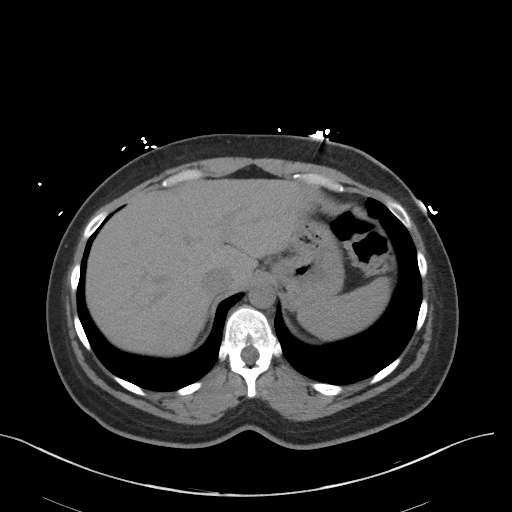
[im 83/87  soft-tissue]
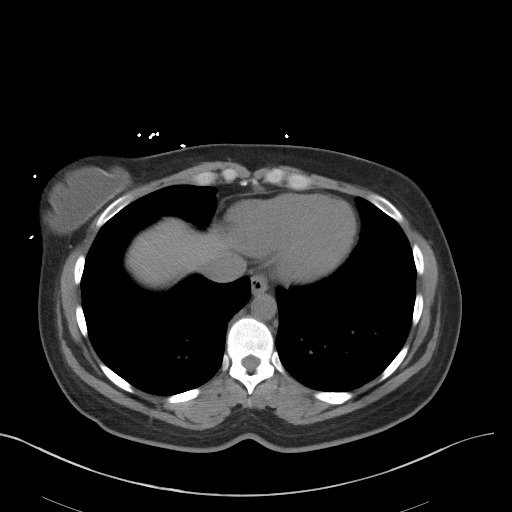

[Series 5: coronal st · coronal · 0.84mm/px · 3 of 89 slices shown]
[im 30/89  soft-tissue]
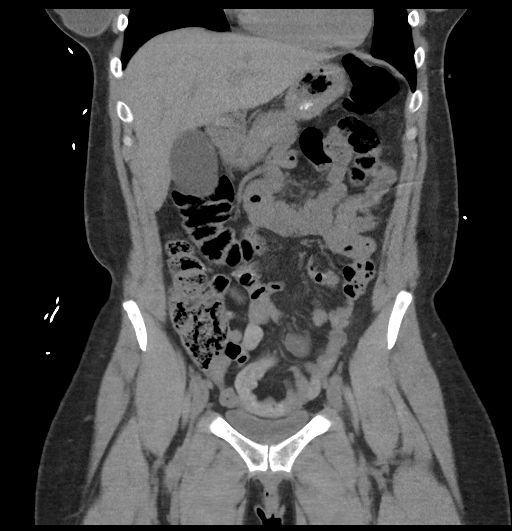
[im 40/89  soft-tissue]
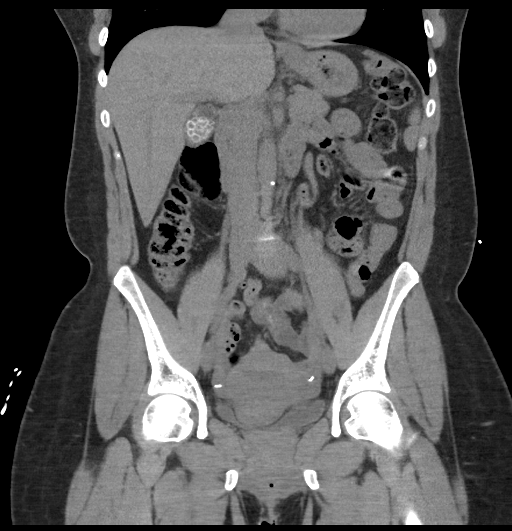
[im 49/89  soft-tissue]
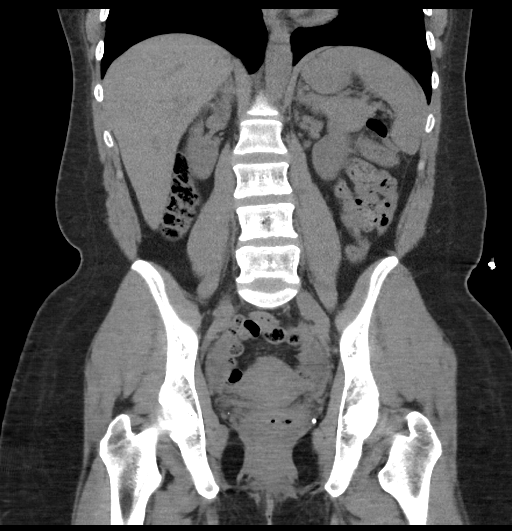

[17 of 46 positions shown; findings below may reference images not displayed]

FINDINGS: Lower chest: No acute abnormality.

Hepatobiliary: Numerous stones are seen in the gallbladder. The
gallbladder is mildly distended with no adjacent fat stranding or
wall thickening identified. The liver is normal in appearance.

Pancreas: The pancreas is normal in appearance with no adjacent
stranding. No CT evidence of pancreatitis.

Spleen: Normal in size without focal abnormality.

Adrenals/Urinary Tract: Adrenal glands are unremarkable. Kidneys are
normal, without renal calculi, focal lesion, or hydronephrosis.
Bladder is unremarkable.

Stomach/Bowel: Stomach is within normal limits. Appendix appears
normal. No evidence of bowel wall thickening, distention, or
inflammatory changes.

Vascular/Lymphatic: No significant vascular findings are present. No
enlarged abdominal or pelvic lymph nodes.

Reproductive: Uterus and bilateral adnexa are unremarkable.

Other: No abdominal wall hernia or abnormality. No abdominopelvic
ascites.

Musculoskeletal: No acute or significant osseous findings.
IMPRESSION: 1. Cholelithiasis with numerous stones in the gallbladder.
Gallbladder wall distention identified. No wall thickening or
adjacent fat stranding identified. If there is concern for acute
cholecystitis, recommend an ultrasound and/or HIDA scan.
2. No other abnormalities identified. Specifically, no evidence of
pancreatitis on this study.

## 2018-03-18 IMAGING — RF DG CHOLANGIOGRAM OPERATIVE
1 series · 4 of 4 positions shown · non-contrast
Comparison: Ultrasound 12/25/2016

CLINICAL DATA: Laparoscopic cholecystectomy

EXAM:
INTRAOPERATIVE CHOLANGIOGRAM
TECHNIQUE: Cholangiographic images from the C-arm fluoroscopic device were
submitted for interpretation post-operatively. Please see the
procedural report for the amount of contrast and the fluoroscopy
time utilized.

[Series 1: run · 4 of 87 frames shown]
[frame 14/87]
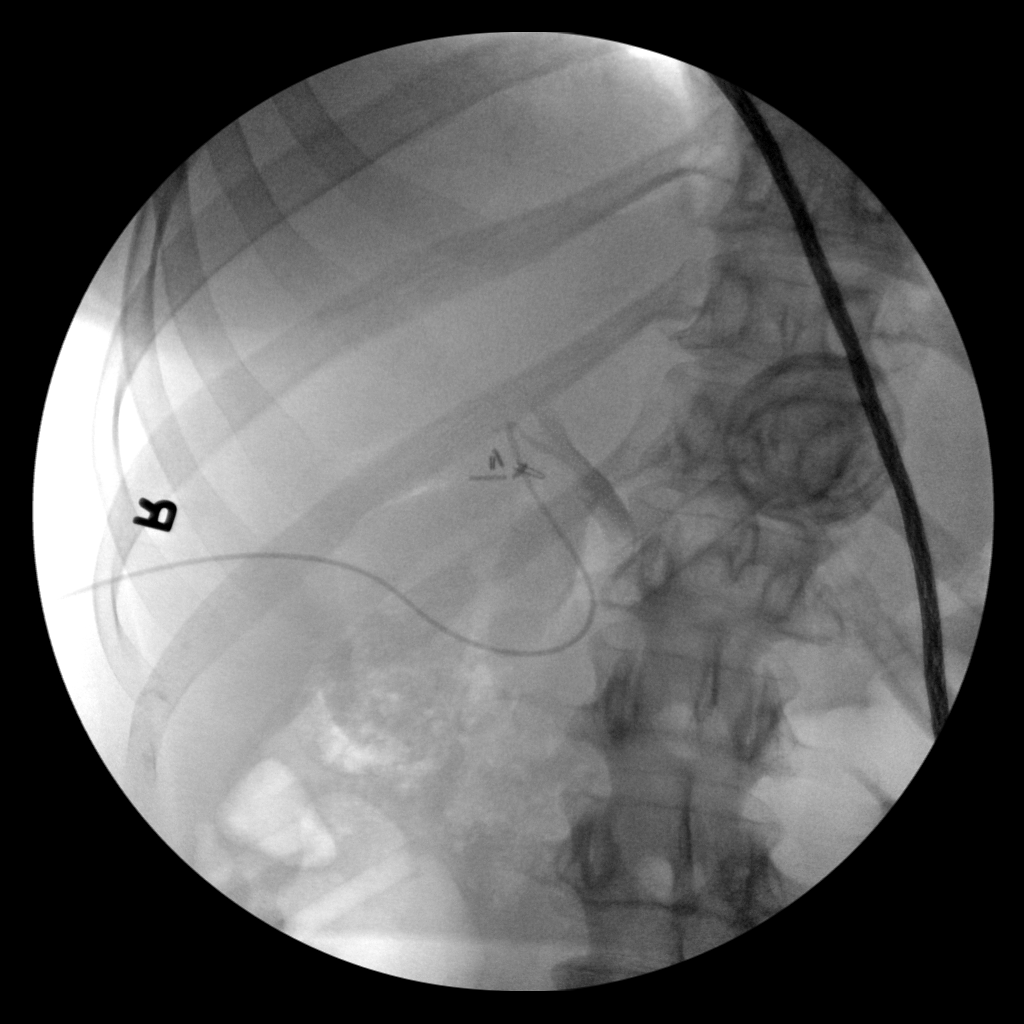
[frame 44/87]
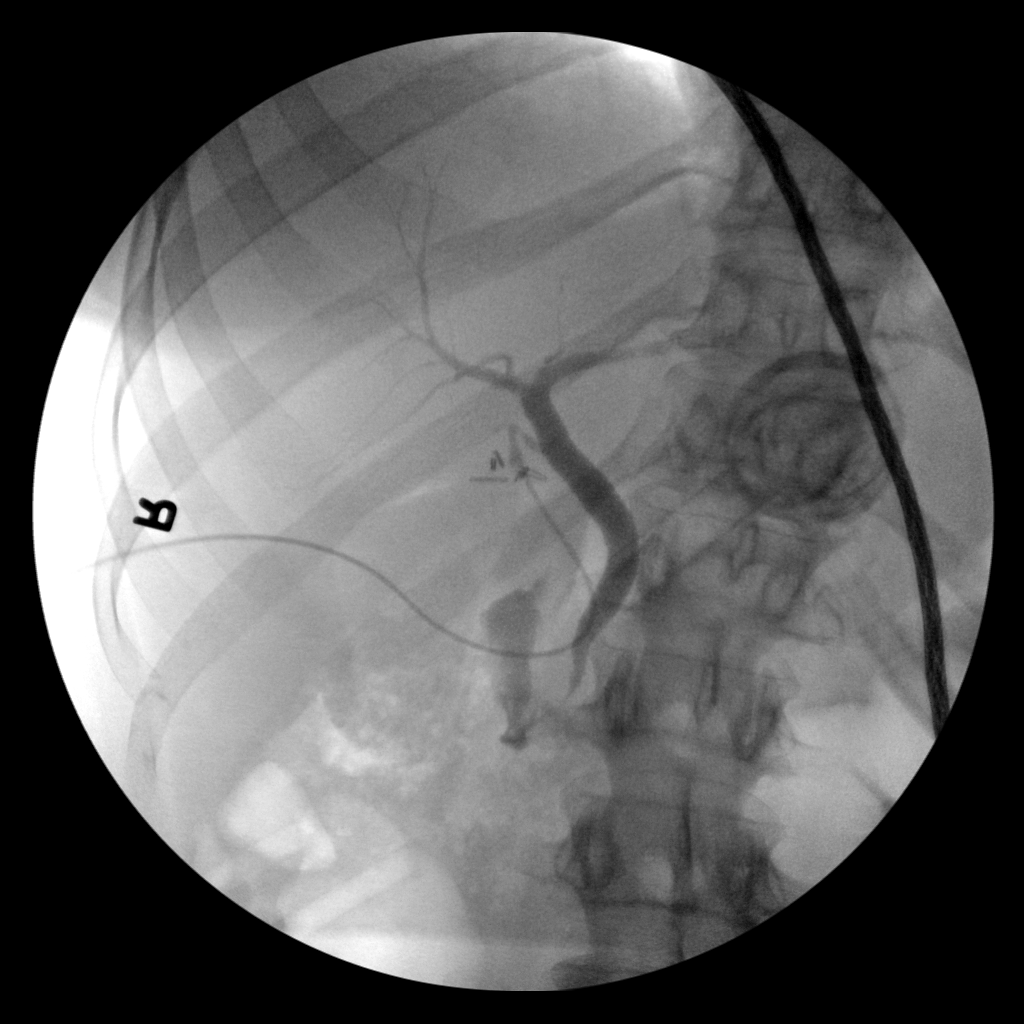
[frame 69/87]
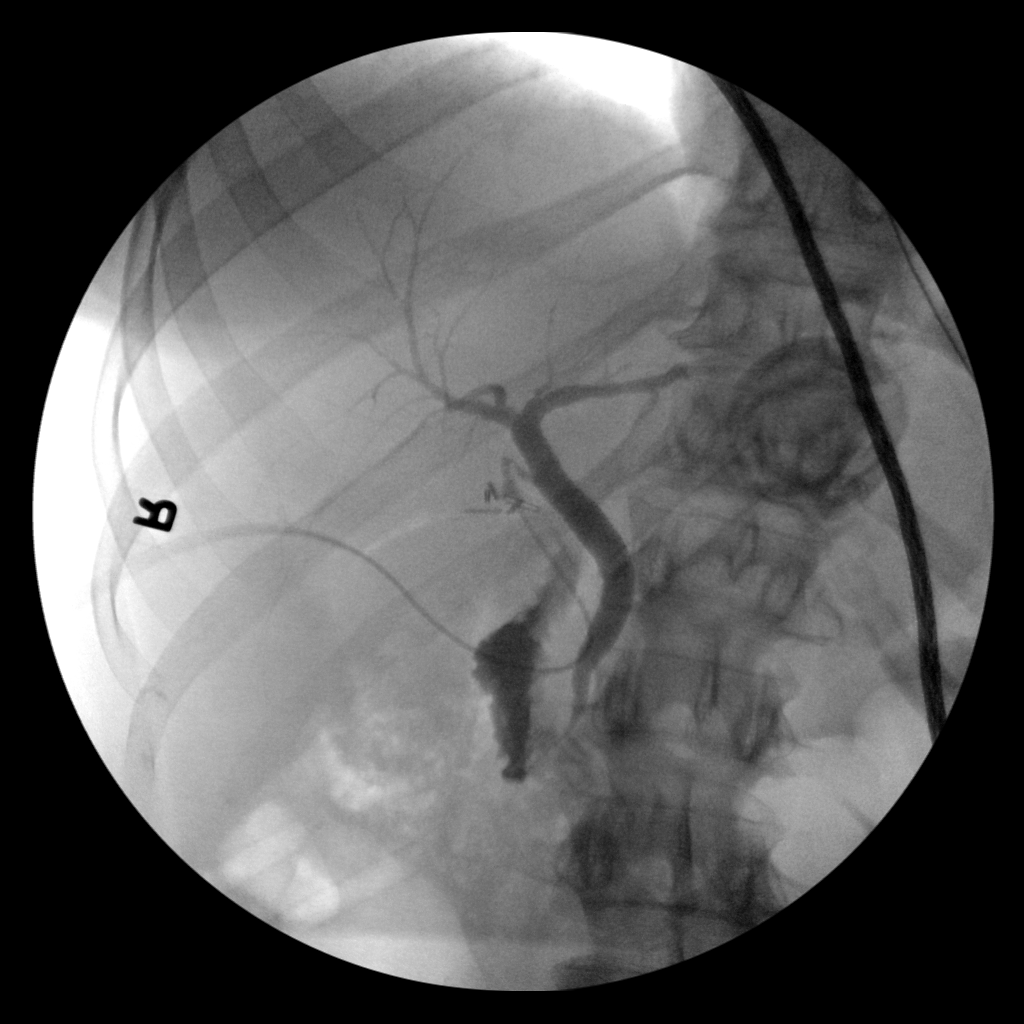
[frame 74/87]
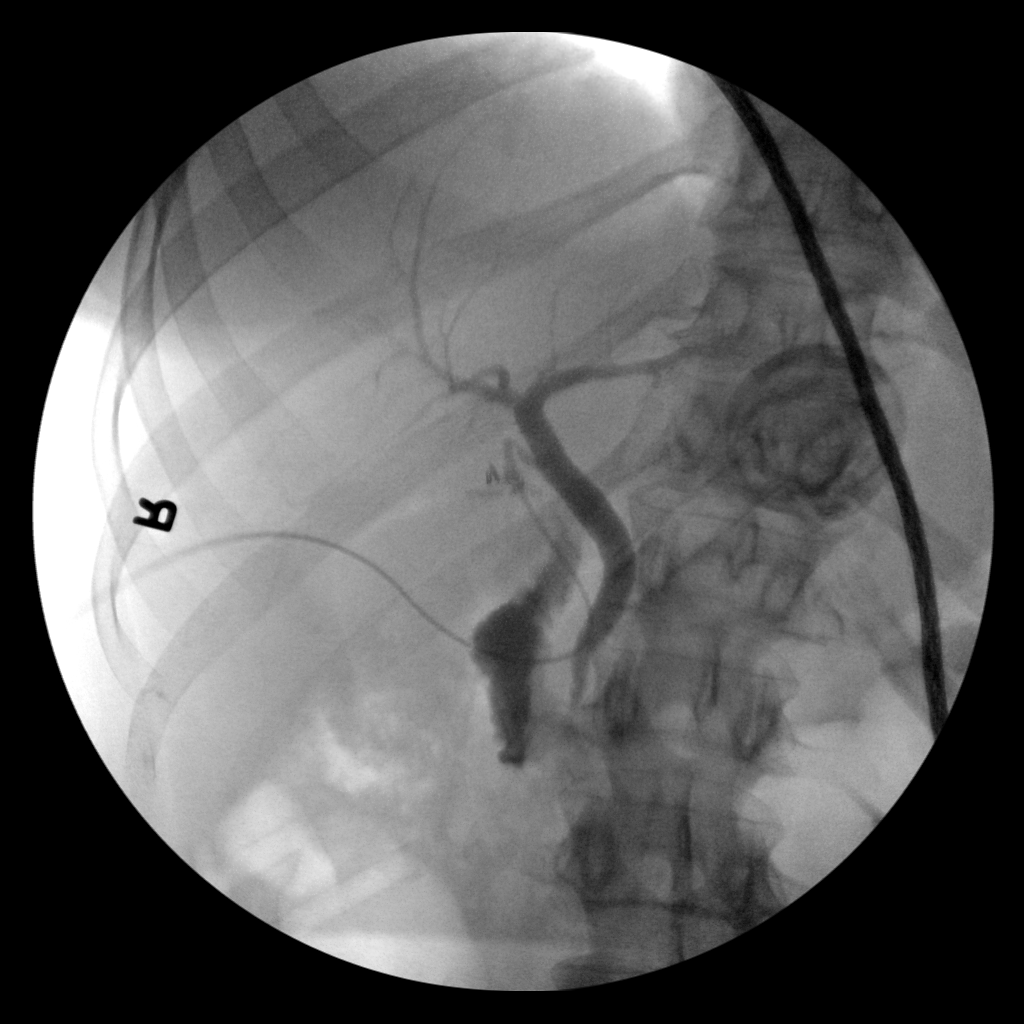

[4 of 4 positions shown; findings below may reference images not displayed]

FINDINGS: No persistent filling defects in the common duct. Intrahepatic ducts
are incompletely visualized, appearing decompressed centrally.
Contrast passes into the duodenum.

:
Negative for retained common duct stone.

## 2018-10-28 DIAGNOSIS — R2681 Unsteadiness on feet: Secondary | ICD-10-CM | POA: Insufficient documentation

## 2018-12-28 ENCOUNTER — Ambulatory Visit (INDEPENDENT_AMBULATORY_CARE_PROVIDER_SITE_OTHER): Payer: 59

## 2018-12-28 ENCOUNTER — Other Ambulatory Visit: Payer: Self-pay | Admitting: Podiatry

## 2018-12-28 ENCOUNTER — Other Ambulatory Visit: Payer: Self-pay

## 2018-12-28 ENCOUNTER — Encounter: Payer: Self-pay | Admitting: Podiatry

## 2018-12-28 ENCOUNTER — Ambulatory Visit: Payer: 59 | Admitting: Podiatry

## 2018-12-28 DIAGNOSIS — M2011 Hallux valgus (acquired), right foot: Secondary | ICD-10-CM

## 2018-12-28 DIAGNOSIS — M2012 Hallux valgus (acquired), left foot: Secondary | ICD-10-CM

## 2018-12-28 DIAGNOSIS — M79671 Pain in right foot: Secondary | ICD-10-CM

## 2018-12-28 DIAGNOSIS — M79672 Pain in left foot: Secondary | ICD-10-CM

## 2018-12-28 NOTE — Patient Instructions (Addendum)
Bunion  A bunion is a bump on the base of the big toe that forms when the bones of the big toe joint move out of position. Bunions may be small at first, but they often get larger over time. They can make walking painful. What are the causes? A bunion may be caused by:  Wearing narrow or pointed shoes that force the big toe to press against the other toes.  Abnormal foot development that causes the foot to roll inward (pronate).  Changes in the foot that are caused by certain diseases, such as rheumatoid arthritis or polio.  A foot injury. What increases the risk? The following factors may make you more likely to develop this condition:  Wearing shoes that squeeze the toes together.  Having certain diseases, such as: ? Rheumatoid arthritis. ? Polio. ? Cerebral palsy.  Having family members who have bunions.  Being born with a foot deformity, such as flat feet or low arches.  Doing activities that put a lot of pressure on the feet, such as ballet dancing. What are the signs or symptoms? The main symptom of a bunion is a noticeable bump on the big toe. Other symptoms may include:  Pain.  Swelling around the big toe.  Redness and inflammation.  Thick or hardened skin on the big toe or between the toes.  Stiffness or loss of motion in the big toe.  Trouble with walking. How is this diagnosed? A bunion may be diagnosed based on your symptoms, medical history, and activities. You may have tests, such as:  X-rays. These allow your health care provider to check the position of the bones in your foot and look for damage to your joint. They also help your health care provider determine the severity of your bunion and the best way to treat it.  Joint aspiration. In this test, a sample of fluid is removed from the toe joint. This test may be done if you are in a lot of pain. It helps rule out diseases that cause painful swelling of the joints, such as arthritis. How is this  treated? Treatment depends on the severity of your symptoms. The goal of treatment is to relieve symptoms and prevent the bunion from getting worse. Your health care provider may recommend:  Wearing shoes that have a wide toe box.  Using bunion pads to cushion the affected area.  Taping your toes together to keep them in a normal position.  Placing a device inside your shoe (orthotics) to help reduce pressure on your toe joint.  Taking medicine to ease pain, inflammation, and swelling.  Applying heat or ice to the affected area.  Doing stretching exercises.  Surgery to remove scar tissue and move the toes back into their normal position. This treatment is rare. Follow these instructions at home: Managing pain, stiffness, and swelling   If directed, put ice on the painful area: ? Put ice in a plastic bag. ? Place a towel between your skin and the bag. ? Leave the ice on for 20 minutes, 2-3 times a day. Activity   If directed, apply heat to the affected area before you exercise. Use the heat source that your health care provider recommends, such as a moist heat pack or a heating pad. ? Place a towel between your skin and the heat source. ? Leave the heat on for 20-30 minutes. ? Remove the heat if your skin turns bright red. This is especially important if you are unable to feel pain,   heat, or cold. You may have a greater risk of getting burned.  Do exercises as told by your health care provider. General instructions  Support your toe joint with proper footwear, shoe padding, or taping as told by your health care provider.  Take over-the-counter and prescription medicines only as told by your health care provider.  Keep all follow-up visits as told by your health care provider. This is important. Contact a health care provider if your symptoms:  Get worse.  Do not improve in 2 weeks. Get help right away if you have:  Severe pain and trouble with walking. Summary  A  bunion is a bump on the base of the big toe that forms when the bones of the big toe joint move out of position.  Bunions can make walking painful.  Treatment depends on the severity of your symptoms.  Support your toe joint with proper footwear, shoe padding, or taping as told by your health care provider. This information is not intended to replace advice given to you by your health care provider. Make sure you discuss any questions you have with your health care provider. Document Released: 01/04/2005 Document Revised: 07/11/2017 Document Reviewed: 05/17/2017 Elsevier Patient Education  2020 [employer]  Pre-Operative Instructions  Congratulations, you have decided to take an important step towards improving your quality of life.  You can be assured that the doctors and staff at [facility] will be with you every step of the way.  Here are some important things you should know:  1. Plan to be at the surgery center/hospital at least 1 (one) hour prior to your scheduled time, unless otherwise directed by the surgical center/hospital staff.  You must have a responsible adult accompany you, remain during the surgery and drive you home.  Make sure you have directions to the surgical center/hospital to ensure you arrive on time. 2. If you are having surgery at [facility] or [facility], you will need a copy of your medical history and physical form from your family physician within one month prior to the date of surgery. We will give you a form for your primary physician to complete.  3. We make every effort to accommodate the date you request for surgery.  However, there are times where surgery dates or times have to be moved.  We will contact you as soon as possible if a change in schedule is required.   4. No aspirin/ibuprofen for one week before surgery.  If you are on aspirin, any non-steroidal anti-inflammatory medications (Mobic, Aleve, Ibuprofen) should not be taken seven  (7) days prior to your surgery.  You make take Tylenol for pain prior to surgery.  5. Medications - If you are taking daily heart and blood pressure medications, seizure, reflux, allergy, asthma, anxiety, pain or diabetes medications, make sure you notify the surgery center/hospital before the day of surgery so they can tell you which medications you should take or avoid the day of surgery. 6. No food or drink after midnight the night before surgery unless directed otherwise by surgical center/hospital staff. 7. No alcoholic beverages 24-hours prior to surgery.  No smoking 24-hours prior or 24-hours after surgery. 8. Wear loose pants or shorts. They should be loose enough to fit over bandages, boots, and casts. 9. Don't wear slip-on shoes. Sneakers are preferred. 10. Bring your boot with you to the surgery center/hospital.  Also bring crutches or a walker if your physician has prescribed it for you.    If you do not have this equipment, it will be provided for you after surgery. 11. If you have not been contacted by the surgery center/hospital by the day before your surgery, call to confirm the date and time of your surgery. 12. Leave-time from work may vary depending on the type of surgery you have.  Appropriate arrangements should be made prior to surgery with your employer. 13. Prescriptions will be provided immediately following surgery by your doctor.  Fill these as soon as possible after surgery and take the medication as directed. Pain medications will not be refilled on weekends and must be approved by the doctor. 14. Remove nail polish on the operative foot and avoid getting pedicures prior to surgery. 15. Wash the night before surgery.  The night before surgery wash the foot and leg well with water and the antibacterial soap provided. Be sure to pay special attention to beneath the toenails and in between the toes.  Wash for at least three (3) minutes. Rinse thoroughly with water and dry well with  a towel.  Perform this wash unless told not to do so by your physician.  Enclosed: 1 Ice pack (please put in freezer the night before surgery)   1 Hibiclens skin cleaner   Pre-op instructions  If you have any questions regarding the instructions, please do not hesitate to call our office.  [place]: [address], [place], [place] [address] -- [place]: [address], [place], [place] [address] -- [phone]  [place]: [place] [address], [place], [place] [address] -- [phone]   Website: [email or web address]

## 2018-12-28 NOTE — Progress Notes (Signed)
   Subjective:    Patient ID: Teresa Flores, female    DOB: March 06, 1975, 43 y.o.   MRN: [id number]  HPI    Review of Systems  All other systems reviewed and are negative.      Objective:   Physical Exam        Assessment & Plan:

## 2019-01-01 ENCOUNTER — Telehealth: Payer: Self-pay | Admitting: Podiatry

## 2019-01-01 NOTE — Telephone Encounter (Addendum)
DOS: 01/16/2019  SURGICAL PROCEDURE: [employer] [place] Bi-Planar w/ Fixation [phone])  [employer] Effective 02/18/2018 -  Deductible is $500 with $0 met and $500 remaining. Out of Pocket is $3,000 with $142.50 met and $2,857.50 remaining.  Authorization# [id number] from 12.29.2020 - 03.29.2021  CPT code 28296 Approved/Covered on 01/01/2019.

## 2019-01-01 NOTE — Progress Notes (Signed)
Subjective:   Patient ID: Teresa Flores, female   DOB: 43 y.o.   MRN: [id number]   HPI Patient presents stating that her right bunion has become increasingly symptomatic and she wants to get it fixed like the left one which was done 12 years ago.  States she thinks that I did the first surgery but she is not sure and patient does not smoke likes to be active   Review of Systems  All other systems reviewed and are negative.       Objective:  Physical Exam Vitals and nursing note reviewed.  Constitutional:      Appearance: She is well-developed.  Pulmonary:     Effort: Pulmonary effort is normal.  Musculoskeletal:        General: Normal range of motion.  Skin:    General: Skin is warm.  Neurological:     Mental Status: She is alert.     Neurovascular status found to be intact muscle strength was adequate range of motion within normal limits.  Patient is found to have excellent healing of the first metatarsal left with good alignment noted and good range of motion.  On the right I noted that there is a hyperostotic area red and painful with inflammation around the joint surface.  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  I recommended surgical intervention with distal osteotomy and removal of any spurring and explained procedure risk to patient and she wants to get this done soon due to her schedule and as a result since she had the other one done I did allow her to go over consent form today reviewing alternative treatments and complications.  Patient is willing to accept risk of surgery and after extensive review signed consent form understanding total  recovery can take 6 months to 1 year with no long-term guarantees.  I dispensed a boot for postoperative usage and gave instructions on getting used to it prior to surgery and encouraged her to call with any questions concerns prior to procedure  X-rays indicate that there is structural bunion deformity right with corrected left with fixation in place

## 2019-01-15 MED ORDER — OXYCODONE-ACETAMINOPHEN 10-325 MG PO TABS: 1.0000 | ORAL_TABLET | ORAL | 0 refills | Status: DC | PRN

## 2019-01-15 NOTE — Addendum Note (Signed)
Addended by: Wallene Huh on: 01/15/2019 05:44 PM   Modules accepted: Orders

## 2019-01-16 ENCOUNTER — Encounter: Payer: Self-pay | Admitting: Podiatry

## 2019-01-16 DIAGNOSIS — M2011 Hallux valgus (acquired), right foot: Secondary | ICD-10-CM

## 2019-01-23 ENCOUNTER — Telehealth: Payer: Self-pay | Admitting: *Deleted

## 2019-01-23 NOTE — Telephone Encounter (Signed)
Pt states she is concerned with the bruising to her surgery foot.

## 2019-01-23 NOTE — Telephone Encounter (Signed)
I left message informing pt that it was not uncommon to have swelling after surgery, often it is related to being up on the surgery foot too much, to rest, ice and elevate. I explained the bruising was due to the blood rushing back in to the foot after the tourniquet was removed and it would change colors from red, to purple to black and then possibly yellow, and to call again so I could go over other questions.

## 2019-01-24 ENCOUNTER — Ambulatory Visit (INDEPENDENT_AMBULATORY_CARE_PROVIDER_SITE_OTHER): Payer: 59

## 2019-01-24 ENCOUNTER — Other Ambulatory Visit: Payer: Self-pay

## 2019-01-24 ENCOUNTER — Encounter: Payer: Self-pay | Admitting: Podiatry

## 2019-01-24 ENCOUNTER — Ambulatory Visit (INDEPENDENT_AMBULATORY_CARE_PROVIDER_SITE_OTHER): Payer: 59 | Admitting: Podiatry

## 2019-01-24 VITALS — BP 108/63 | HR 64 | Temp 98.0°F

## 2019-01-24 DIAGNOSIS — M2021 Hallux rigidus, right foot: Secondary | ICD-10-CM

## 2019-01-24 DIAGNOSIS — M79671 Pain in right foot: Secondary | ICD-10-CM

## 2019-01-24 NOTE — Progress Notes (Signed)
Subjective:   Patient ID: Teresa Flores, female   DOB: 44 y.o.   MRN: [id number]   HPI Patient presents stating doing very well with her foot with minimal discomfort and states that there is some intermittent throbbing if she does too much   ROS      Objective:  Physical Exam  Neurovascular status intact negative Teresa Flores' sign noted with patient's right foot doing very well wound edges well coapted good range of motion no crepitus of the joint     Assessment:  Doing well after hallux limitus rigidus surgery right     Plan:  H&P x-ray reviewed and strongly recommended physical therapy to try to improve the motion of the joint and patient will have this done starting next week.  I went ahead applied compression continue immobilization elevation and reappoint to recheck  X-rays indicate osteotomies healing well joint is open with no indications of significant pathology

## 2019-01-25 ENCOUNTER — Encounter: Payer: 59 | Admitting: Podiatry

## 2019-01-26 ENCOUNTER — Encounter: Payer: 59 | Admitting: Podiatry

## 2019-02-08 ENCOUNTER — Ambulatory Visit (INDEPENDENT_AMBULATORY_CARE_PROVIDER_SITE_OTHER): Payer: 59 | Admitting: Podiatry

## 2019-02-08 ENCOUNTER — Encounter: Payer: Self-pay | Admitting: Podiatry

## 2019-02-08 ENCOUNTER — Other Ambulatory Visit: Payer: Self-pay

## 2019-02-08 ENCOUNTER — Ambulatory Visit (INDEPENDENT_AMBULATORY_CARE_PROVIDER_SITE_OTHER): Payer: 59

## 2019-02-08 VITALS — Temp 97.6°F

## 2019-02-08 DIAGNOSIS — M2021 Hallux rigidus, right foot: Secondary | ICD-10-CM | POA: Diagnosis not present

## 2019-02-08 NOTE — Progress Notes (Signed)
Subjective:   Patient ID: Teresa Flores, female   DOB: 44 y.o.   MRN: [id number]   HPI Patient presents stating doing well with the right foot stating that pain is only approximate 4 out of 10 and she started to walk better and really enjoys physical therapy near   ROS      Objective:  Physical Exam  Neurovascular status intact negative Denna Haggard' sign noted wound edges well coapted good range of motion first MPJ with physical therapy that is helping tremendously with no pain upon motion     Assessment:  Doing well post osteotomy first metatarsal right     Plan:  H&P reviewed condition and recommended the continuation of conservative care and physical therapy.  Gradual return to soft shoes over the next couple weeks and dispensed an ankle compression stocking and reappoint to recheck  X-rays indicate the osteotomy is healing well good alignment noted fixation in place

## 2019-02-09 ENCOUNTER — Encounter: Payer: 59 | Admitting: Podiatry

## 2019-02-23 ENCOUNTER — Encounter: Payer: 59 | Admitting: Podiatry

## 2019-03-01 ENCOUNTER — Other Ambulatory Visit: Payer: Self-pay | Admitting: Podiatry

## 2019-03-01 DIAGNOSIS — M2021 Hallux rigidus, right foot: Secondary | ICD-10-CM

## 2019-03-08 ENCOUNTER — Encounter: Payer: 59 | Admitting: Podiatry

## 2019-03-19 ENCOUNTER — Other Ambulatory Visit: Payer: Self-pay

## 2019-03-19 ENCOUNTER — Ambulatory Visit (INDEPENDENT_AMBULATORY_CARE_PROVIDER_SITE_OTHER): Payer: 59

## 2019-03-19 ENCOUNTER — Ambulatory Visit (INDEPENDENT_AMBULATORY_CARE_PROVIDER_SITE_OTHER): Payer: 59 | Admitting: Podiatry

## 2019-03-19 ENCOUNTER — Encounter: Payer: Self-pay | Admitting: Podiatry

## 2019-03-19 VITALS — Temp 97.3°F

## 2019-03-19 DIAGNOSIS — M2021 Hallux rigidus, right foot: Secondary | ICD-10-CM

## 2019-03-21 NOTE — Progress Notes (Signed)
Subjective:   Patient ID: Teresa Flores, female   DOB: 44 y.o.   MRN: [id number]   HPI Patient states doing really well with continued reduction of discomfort and overall I continue to improve but I still note pain periodically   ROS      Objective:  Physical Exam  Neurovascular status intact with patient's right first MPJ healing well with improved motion but still some discomfort upon forced dorsiflexion with mild edema still noted     Assessment:  Patient continues to improve but does still have mild swelling consistent with 8 weeks after surgery     Plan:  H&P condition reviewed and at this point I have recommended continuation with conservative care anti-inflammatories and physical therapy.  Patient will be seen back to recheck again 8 weeks or earlier if needed  X-rays indicate the osteotomy is healing well fixation in place joint congruence

## 2019-05-14 ENCOUNTER — Encounter: Payer: Self-pay | Admitting: Podiatry

## 2019-05-14 ENCOUNTER — Ambulatory Visit (INDEPENDENT_AMBULATORY_CARE_PROVIDER_SITE_OTHER): Payer: 59 | Admitting: Podiatry

## 2019-05-14 ENCOUNTER — Other Ambulatory Visit: Payer: Self-pay

## 2019-05-14 ENCOUNTER — Ambulatory Visit (INDEPENDENT_AMBULATORY_CARE_PROVIDER_SITE_OTHER): Payer: 59

## 2019-05-14 VITALS — Temp 97.8°F

## 2019-05-14 DIAGNOSIS — M2021 Hallux rigidus, right foot: Secondary | ICD-10-CM | POA: Diagnosis not present

## 2019-05-14 DIAGNOSIS — M779 Enthesopathy, unspecified: Secondary | ICD-10-CM | POA: Diagnosis not present

## 2019-05-14 MED ORDER — DICLOFENAC SODIUM 75 MG PO TBEC: 75.0000 mg | DELAYED_RELEASE_TABLET | Freq: Two times a day (BID) | ORAL | 2 refills | Status: AC

## 2019-05-14 NOTE — Progress Notes (Signed)
Subjective:   Patient ID: Teresa Flores, female   DOB: 44 y.o.   MRN: [id number]   HPI Patient states overall she is doing pretty well but she does get some soreness in her foot if she does too much   ROS      Objective:  Physical Exam  Neurovascular status intact patient's right foot overall healing well but there is discomfort and she feels like if she stands on her foot all day     Assessment:  Relatively normal but is having a little bit of inflammation in the right foot     Plan:  Reviewed condition and x-ray and advised on padding which was dispensed today and I placed on oral anti-inflammatory and this should get completely back to normal but [name] patient is encouraged to let us know if any issues were to occur  X-rays indicate osteotomies healing well fixation in place joint congruence

## 2019-08-23 IMAGING — US US ABDOMEN LIMITED
1 series · 14 of 25 positions shown · non-contrast
Comparison: CT abdomen and pelvis 12/25/2016

CLINICAL DATA: Epigastric pain, nausea and vomiting last night,
gallstones on CT

EXAM:
ULTRASOUND ABDOMEN LIMITED RIGHT UPPER QUADRANT

[Series 1: us abdomen limited · 0.18mm/px · 14 of 43 slices shown]
[im 1/43]
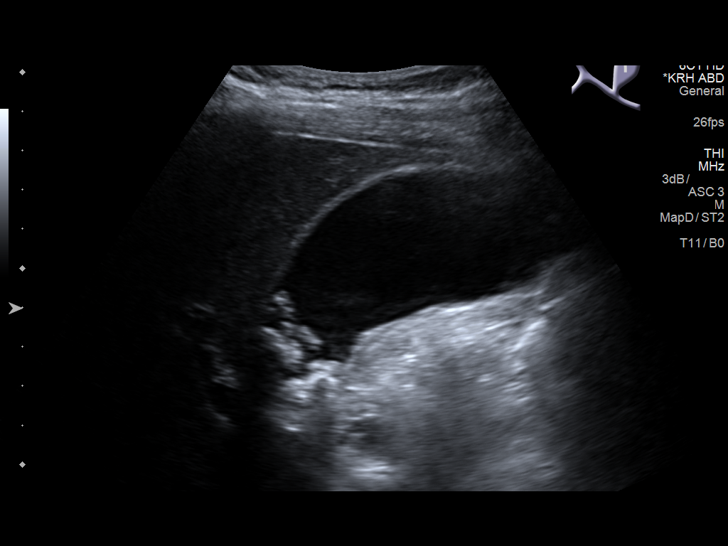
[im 4/43]
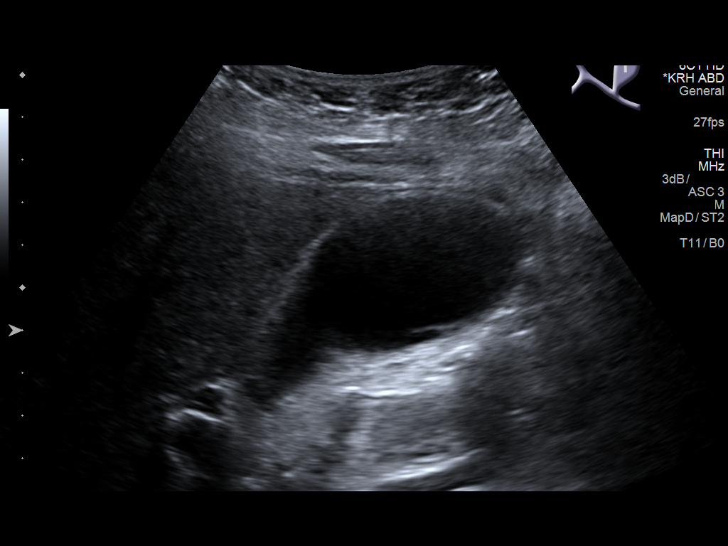
[im 8/43]
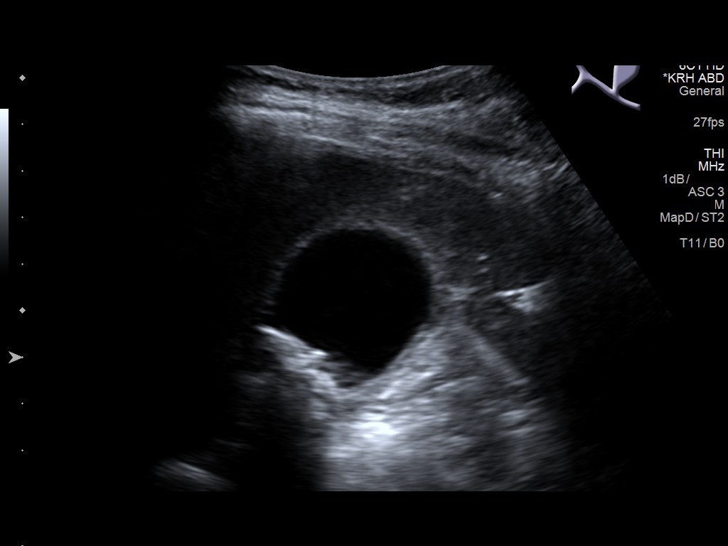
[im 11/43]
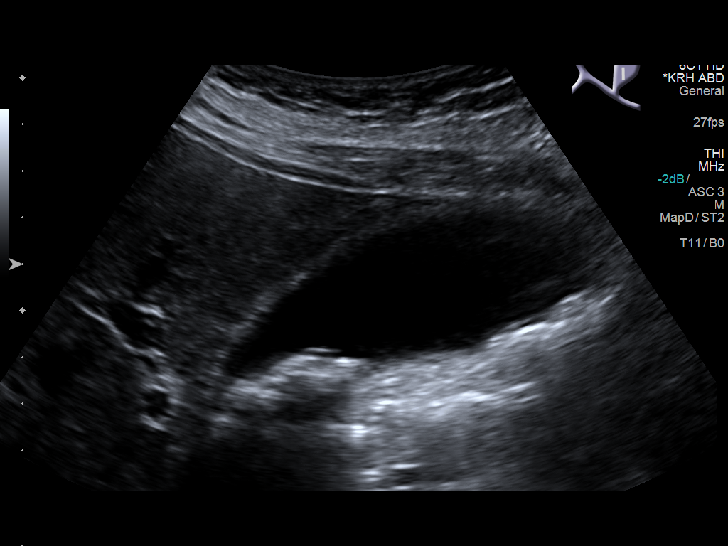
[im 15/43]
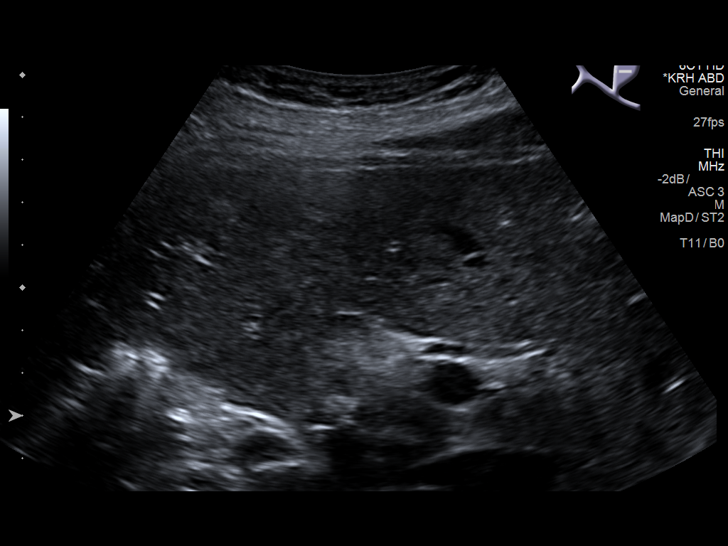
[im 16/43]
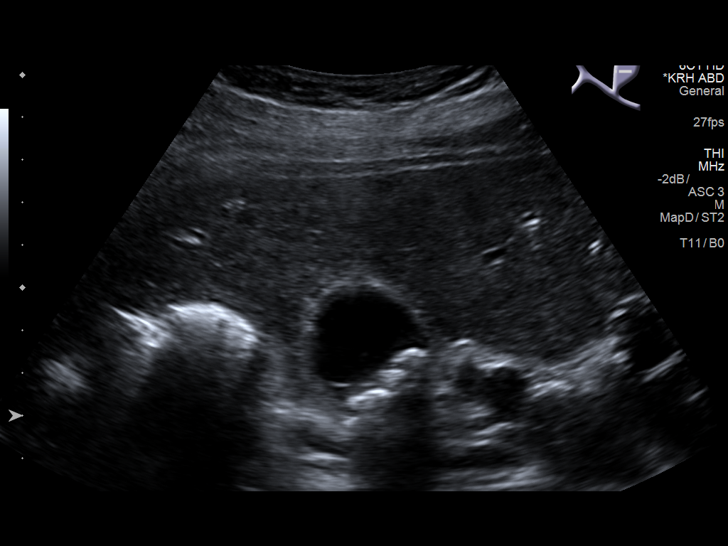
[im 20/43]
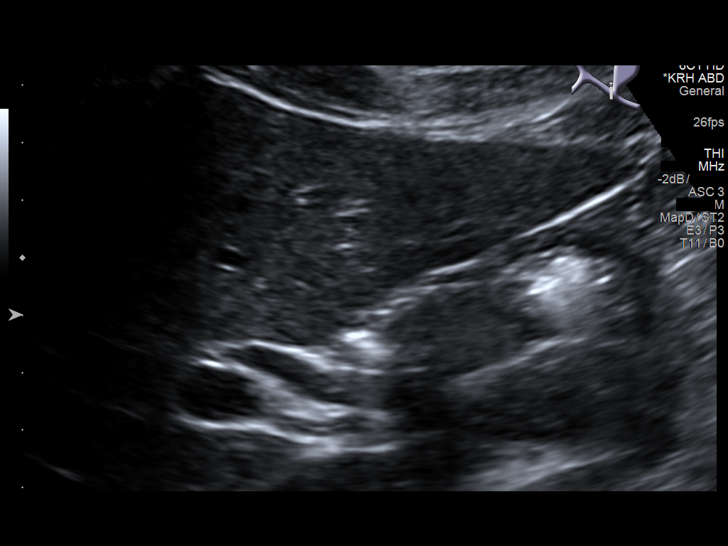
[im 23/43]
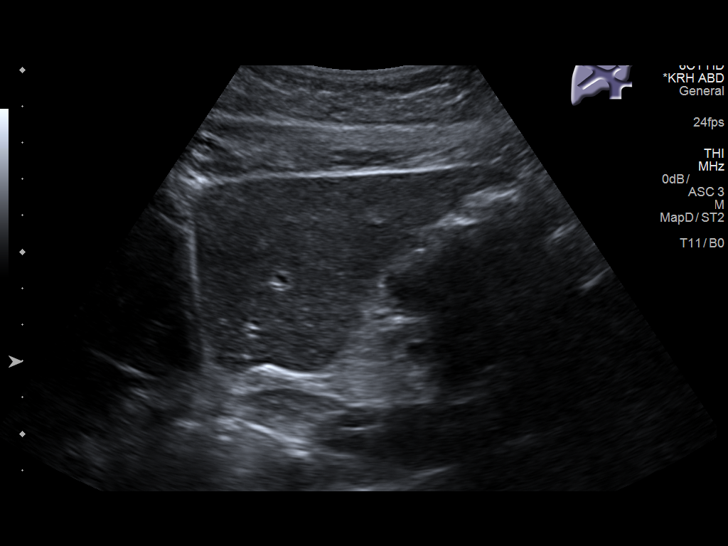
[im 27/43]
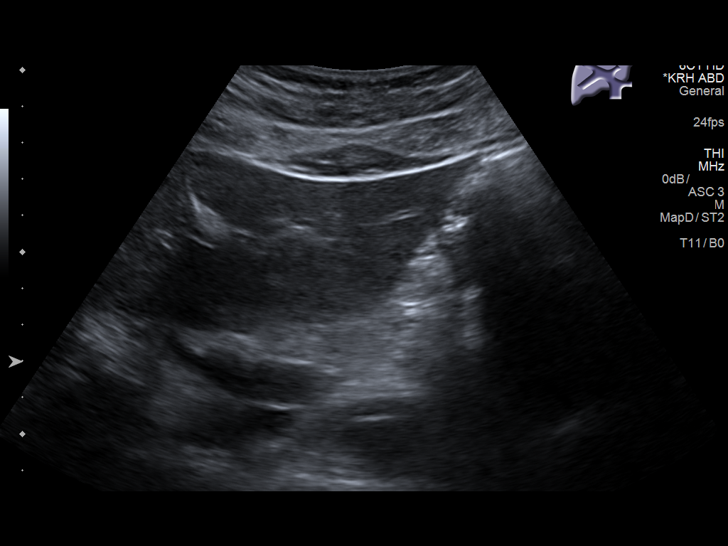
[im 29/43]
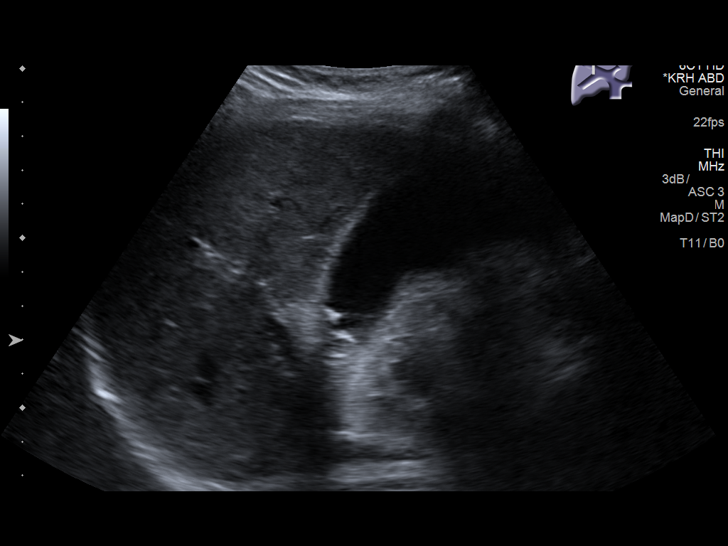
[im 32/43]
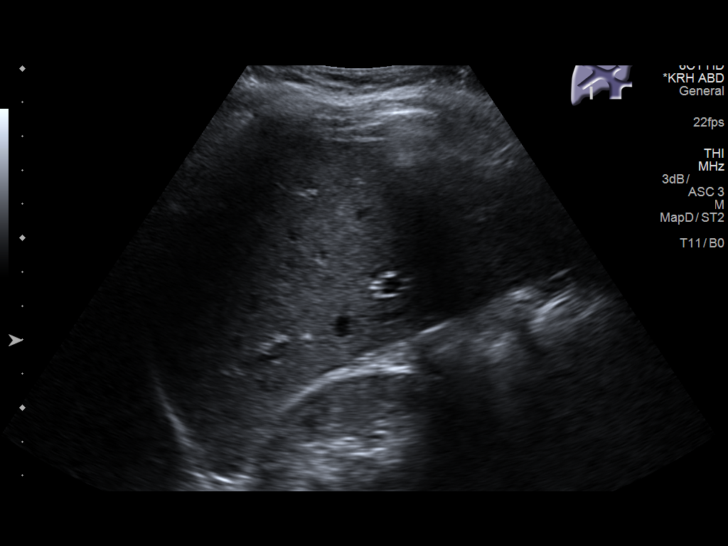
[im 36/43]
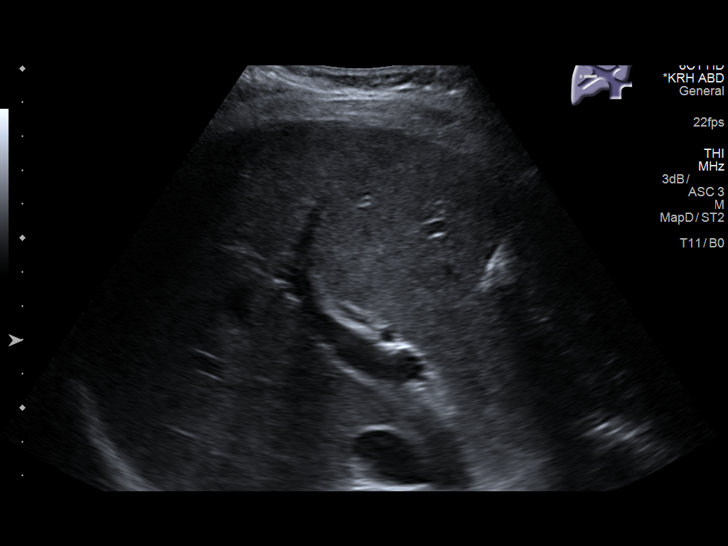
[im 39/43]
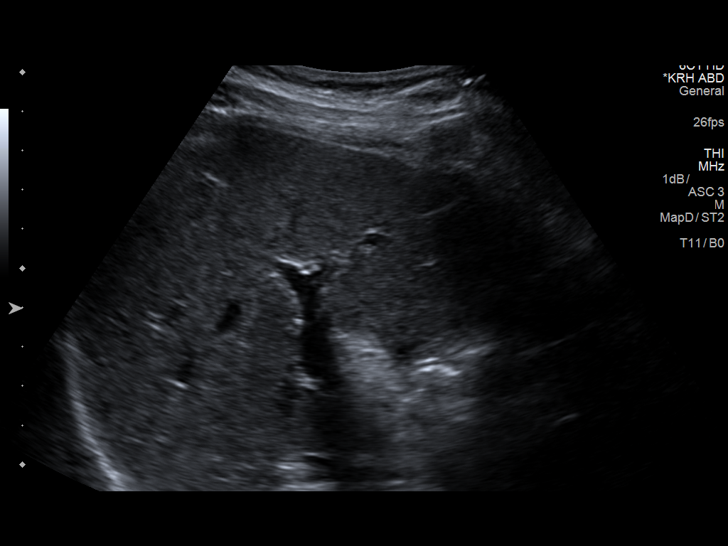
[im 43/43]
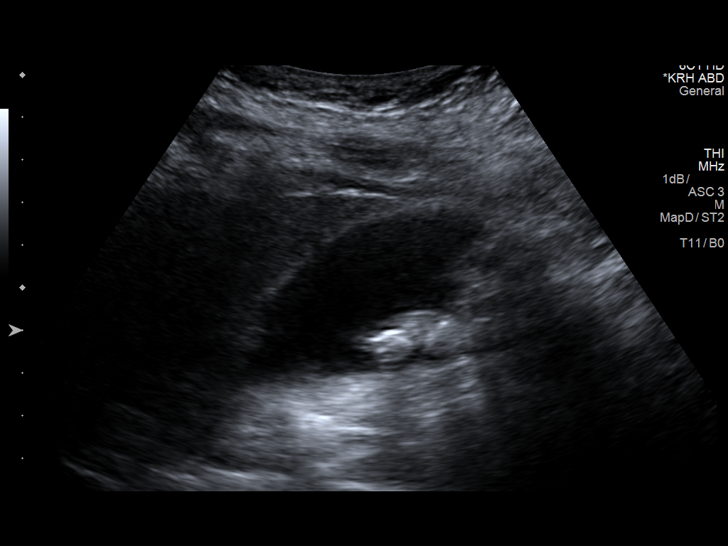

[14 of 25 positions shown; findings below may reference images not displayed]

FINDINGS: Gallbladder:

Multiple shadowing calculi within gallbladder. Largest discrete
calculus 12 mm diameter. Gallbladder appears mildly distended. No
definite gallbladder wall thickening or pericholecystic fluid. No
sonographic Murphy sign.

Common bile duct:

Diameter: 5 mm diameter, normal

Liver:

Normal appearance. No mass or intrahepatic biliary dilatation Portal
vein is patent on color Doppler imaging with normal direction of
blood flow towards the liver.

No RIGHT upper quadrant free fluid.
IMPRESSION: Cholelithiasis without evidence acute cholecystitis or biliary
dilatation.

## 2023-12-29 ENCOUNTER — Other Ambulatory Visit: Payer: Self-pay | Admitting: Obstetrics and Gynecology

## 2023-12-29 DIAGNOSIS — R928 Other abnormal and inconclusive findings on diagnostic imaging of breast: Secondary | ICD-10-CM

## 2024-01-05 ENCOUNTER — Inpatient Hospital Stay: Admission: RE | Admit: 2024-01-05

## 2024-01-05 DIAGNOSIS — R928 Other abnormal and inconclusive findings on diagnostic imaging of breast: Secondary | ICD-10-CM
